# Patient Record
Sex: Male | Born: 1975 | Race: Black or African American | Hispanic: No | Marital: Married | Smoking: Never smoker
Health system: Southern US, Community
[De-identification: ages and names within clinical notes are randomized; demographics above are authoritative.]

## PROBLEM LIST (undated history)

## (undated) DIAGNOSIS — E785 Hyperlipidemia, unspecified: Secondary | ICD-10-CM

## (undated) DIAGNOSIS — D696 Thrombocytopenia, unspecified: Secondary | ICD-10-CM

---

## 2019-04-16 ENCOUNTER — Other Ambulatory Visit: Payer: Self-pay | Admitting: Surgery

## 2019-04-16 DIAGNOSIS — R1030 Lower abdominal pain, unspecified: Secondary | ICD-10-CM

## 2019-04-19 ENCOUNTER — Ambulatory Visit
Admission: RE | Admit: 2019-04-19 | Discharge: 2019-04-19 | Disposition: A | Discharge status: Home / Self Care | Payer: BLUE CROSS/BLUE SHIELD | Source: Ambulatory Visit | Attending: Surgery | Admitting: Surgery

## 2019-04-19 ENCOUNTER — Other Ambulatory Visit: Payer: Self-pay

## 2019-04-19 DIAGNOSIS — R1030 Lower abdominal pain, unspecified: Secondary | ICD-10-CM | POA: Diagnosis present

## 2019-04-19 MED ORDER — IOHEXOL 300 MG/ML  SOLN
100.0000 mL | Freq: Once | INTRAMUSCULAR | Status: AC | PRN
  Administered 2019-04-19: 100 mL via INTRAVENOUS

## 2019-10-19 ENCOUNTER — Emergency Department
Admission: EM | Admit: 2019-10-19 | Discharge: 2019-10-19 | Disposition: A | Discharge status: Home / Self Care | Payer: BLUE CROSS/BLUE SHIELD | Attending: Emergency Medicine | Admitting: Emergency Medicine

## 2019-10-19 ENCOUNTER — Other Ambulatory Visit: Payer: Self-pay

## 2019-10-19 ENCOUNTER — Emergency Department: Payer: BLUE CROSS/BLUE SHIELD

## 2019-10-19 DIAGNOSIS — R0789 Other chest pain: Secondary | ICD-10-CM | POA: Insufficient documentation

## 2019-10-19 DIAGNOSIS — Y999 Unspecified external cause status: Secondary | ICD-10-CM | POA: Diagnosis not present

## 2019-10-19 DIAGNOSIS — M546 Pain in thoracic spine: Secondary | ICD-10-CM | POA: Insufficient documentation

## 2019-10-19 DIAGNOSIS — Y9389 Activity, other specified: Secondary | ICD-10-CM | POA: Insufficient documentation

## 2019-10-19 MED ORDER — METHOCARBAMOL 500 MG PO TABS: 500.0000 mg | ORAL_TABLET | Freq: Three times a day (TID) | ORAL | 0 refills | Status: AC | PRN
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
## 2019-10-22 ENCOUNTER — Emergency Department
Admission: EM | Admit: 2019-10-22 | Discharge: 2019-10-22 | Disposition: A | Discharge status: Home / Self Care | Payer: BLUE CROSS/BLUE SHIELD | Source: Home / Self Care

## 2019-10-22 ENCOUNTER — Encounter: Payer: Self-pay | Admitting: Emergency Medicine

## 2019-10-22 ENCOUNTER — Emergency Department
Admission: EM | Admit: 2019-10-22 | Discharge: 2019-10-22 | Disposition: A | Discharge status: Home / Self Care | Payer: BLUE CROSS/BLUE SHIELD | Attending: Emergency Medicine | Admitting: Emergency Medicine

## 2019-10-22 ENCOUNTER — Other Ambulatory Visit: Payer: Self-pay

## 2019-10-22 ENCOUNTER — Emergency Department: Payer: BLUE CROSS/BLUE SHIELD

## 2019-10-22 DIAGNOSIS — M436 Torticollis: Secondary | ICD-10-CM | POA: Insufficient documentation

## 2019-10-22 DIAGNOSIS — M542 Cervicalgia: Secondary | ICD-10-CM | POA: Insufficient documentation

## 2019-10-22 DIAGNOSIS — Z041 Encounter for examination and observation following transport accident: Secondary | ICD-10-CM | POA: Insufficient documentation

## 2019-10-22 DIAGNOSIS — Z79899 Other long term (current) drug therapy: Secondary | ICD-10-CM | POA: Insufficient documentation

## 2019-10-22 DIAGNOSIS — M549 Dorsalgia, unspecified: Secondary | ICD-10-CM | POA: Insufficient documentation

## 2019-10-22 DIAGNOSIS — M25511 Pain in right shoulder: Secondary | ICD-10-CM | POA: Insufficient documentation

## 2019-10-22 MED ORDER — BACLOFEN 5 MG PO TABS: 5.0000 mg | ORAL_TABLET | Freq: Three times a day (TID) | ORAL | 0 refills | Status: DC | PRN

## 2019-10-22 NOTE — Discharge Instructions (Signed)
Take the Baclofen as prescribed. You may also take ibuprofen and tylenol.  See primary care or return to the ER if unable to schedule an appointment.
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
## 2019-10-22 NOTE — ED Notes (Signed)
Pt speaking with this RN in NAD, pt was told by PCP to come and get an xray of neck before physical therapy can be started for him.Pt reports pain in neck and upper shoulder area.
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
## 2020-01-31 ENCOUNTER — Other Ambulatory Visit: Payer: Self-pay

## 2020-01-31 ENCOUNTER — Inpatient Hospital Stay: Payer: BLUE CROSS/BLUE SHIELD | Attending: Oncology | Admitting: Oncology

## 2020-01-31 ENCOUNTER — Inpatient Hospital Stay: Payer: BLUE CROSS/BLUE SHIELD

## 2020-01-31 ENCOUNTER — Encounter: Payer: Self-pay | Admitting: Oncology

## 2020-01-31 VITALS — BP 104/62 | HR 68 | Temp 97.0°F | Resp 18 | Wt 207.0 lb

## 2020-01-31 DIAGNOSIS — D696 Thrombocytopenia, unspecified: Secondary | ICD-10-CM

## 2020-01-31 LAB — IRON AND TIBC
Iron: 102 ug/dL (ref 45–182)
Saturation Ratios: 32 % (ref 17.9–39.5)
TIBC: 322 ug/dL (ref 250–450)
UIBC: 220 ug/dL

## 2020-01-31 LAB — FERRITIN: Ferritin: 90 ng/mL (ref 24–336)

## 2020-01-31 LAB — CBC
HCT: 52.1 % — ABNORMAL HIGH (ref 39.0–52.0)
Hemoglobin: 15.6 g/dL (ref 13.0–17.0)
MCH: 26.7 pg (ref 26.0–34.0)
MCHC: 29.9 g/dL — ABNORMAL LOW (ref 30.0–36.0)
MCV: 89.2 fL (ref 80.0–100.0)
Platelets: 106 10*3/uL — ABNORMAL LOW (ref 150–400)
RBC: 5.84 MIL/uL — ABNORMAL HIGH (ref 4.22–5.81)
RDW: 12.7 % (ref 11.5–15.5)
WBC: 4 10*3/uL (ref 4.0–10.5)
nRBC: 0 % (ref 0.0–0.2)

## 2020-01-31 LAB — RETICULOCYTES
Immature Retic Fract: 5.6 % (ref 2.3–15.9)
RBC.: 5.79 MIL/uL (ref 4.22–5.81)
Retic Count, Absolute: 65.4 10*3/uL (ref 19.0–186.0)
Retic Ct Pct: 1.1 % (ref 0.4–3.1)

## 2020-01-31 LAB — FOLATE: Folate: 14.1 ng/mL (ref >5.9)

## 2020-01-31 LAB — VITAMIN B12: Vitamin B-12: 604 pg/mL (ref 180–914)

## 2020-01-31 LAB — LACTATE DEHYDROGENASE: LDH: 158 U/L (ref 98–192)

## 2020-01-31 NOTE — Progress Notes (Signed)
Patient here initial hematology evaluation and does not offer any problems today.

## 2020-02-01 LAB — PLATELET ANTIBODY PROFILE
Glycoprotein IV Antibody: NEGATIVE
HLA Ab Ser Ql EIA: NEGATIVE
IA/IIA Antibody: NEGATIVE
IB/IX Antibody: NEGATIVE
IIB/IIIA Antibody: NEGATIVE

## 2020-02-01 LAB — ANA W/REFLEX: Anti Nuclear Antibody (ANA): NEGATIVE

## 2020-02-01 NOTE — Progress Notes (Signed)
[facility]  [phone]) [phone] [phone]) [phone]  ID: Charles West OB: [birth year]  MR#: [id number]  AJO#:[id number]  Patient Care Team: Baxter Hire, MD as PCP - General (Internal Medicine)  CHIEF COMPLAINT: Thrombocytopenia  INTERVAL HISTORY: Patient is a 44 year old male who was noted to have a persistently decreased platelet count on routine blood work.  He currently feels well and is asymptomatic.  He denies any easy bleeding or bruising.  He has no neurologic complaints.  He denies any recent fevers or illnesses.  He has no new medications.  He has a good appetite and denies weight loss.  He has no chest pain, shortness of breath, cough, or hemoptysis.  He denies any nausea, vomiting, constipation, or diarrhea.  He has no urinary complaints.  Patient feels at his baseline and offers no specific complaints today.  REVIEW OF SYSTEMS:   Review of Systems  Constitutional: Negative.  Negative for fever, malaise/fatigue and weight loss.  Respiratory: Negative.  Negative for cough and shortness of breath.   Cardiovascular: Negative.  Negative for chest pain and leg swelling.  Gastrointestinal: Negative.  Negative for abdominal pain.  Genitourinary: Negative.  Negative for dysuria.  Musculoskeletal: Negative.  Negative for back pain.  Skin: Negative.  Negative for rash.  Neurological: Negative.  Negative for dizziness, focal weakness, weakness and headaches.  Endo/Heme/Allergies: Does not bruise/bleed easily.  Psychiatric/Behavioral: Negative.  The patient is not nervous/anxious.     As per HPI. Otherwise, a complete review of systems is negative.  PAST MEDICAL HISTORY: History reviewed. No pertinent past medical history.  PAST SURGICAL HISTORY: History reviewed. No pertinent surgical history.  FAMILY HISTORY: History reviewed. No pertinent family history.  ADVANCED DIRECTIVES (Y/N):  N  HEALTH MAINTENANCE: Social History   Tobacco Use  .  Smoking status: Never Smoker  . Smokeless tobacco: Never Used  Substance Use Topics  . Alcohol use: Never  . Drug use: Never     Colonoscopy:  PAP:  Bone density:  Lipid panel:  No Known Allergies  Current Outpatient Medications  Medication Sig Dispense Refill  . Baclofen 5 MG TABS Take 5 mg by mouth 3 (three) times daily as needed. (Patient not taking: Reported on 01/31/2020) 15 tablet 0   No current facility-administered medications for this visit.    OBJECTIVE: Vitals:   01/31/20 1131  BP: 104/62  Pulse: 68  Resp: 18  Temp: (!) 97 F (36.1 C)  SpO2: 100%     Body mass index is 28.87 kg/m.    ECOG FS:0 - Asymptomatic  General: Well-developed, well-nourished, no acute distress. Eyes: Pink conjunctiva, anicteric sclera. HEENT: Normocephalic, moist mucous membranes. Lungs: No audible wheezing or coughing. Heart: Regular rate and rhythm. Abdomen: Soft, nontender, no obvious distention. Musculoskeletal: No edema, cyanosis, or clubbing. Neuro: Alert, answering all questions appropriately. Cranial nerves grossly intact. Skin: No rashes or petechiae noted. Psych: Normal affect. Lymphatics: No cervical, calvicular, axillary or inguinal LAD.   LAB RESULTS:  No results found for: NA, K, CL, CO2, GLUCOSE, BUN, CREATININE, CALCIUM, PROT, ALBUMIN, AST, ALT, ALKPHOS, BILITOT, GFRNONAA, GFRAA  Lab Results  Component Value Date   WBC 4.0 01/31/2020   HGB 15.6 01/31/2020   HCT 52.1 (H) 01/31/2020   MCV 89.2 01/31/2020   PLT 106 (L) 01/31/2020     STUDIES: No results found.  ASSESSMENT: Thrombocytopenia  PLAN:    1. 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  Patient's platelet count remains decreased at 106 today.  The remainder of his laboratory work including iron stores, B12, folate, reticulocyte panel, and LDH are all within normal limits.  ANA and platelet antibodies are  pending at time of dictation.  Will get an ultrasound of the spleen to assess for splenomegaly for completeness.  Patient will then have video assisted telemedicine visit in 3 weeks to discuss his results.  I spent a total of 45 minutes reviewing chart data, face-to-face evaluation with the patient, counseling and coordination of care as detailed above.   Patient expressed understanding and was in agreement with this plan. He also understands that He can call clinic at any time with any questions, concerns, or complaints.    [name], MD   02/01/2020 7:30 AM

## 2020-02-06 ENCOUNTER — Ambulatory Visit
Admission: RE | Admit: 2020-02-06 | Discharge: 2020-02-06 | Disposition: A | Discharge status: Home / Self Care | Payer: BLUE CROSS/BLUE SHIELD | Source: Ambulatory Visit | Attending: Oncology | Admitting: Oncology

## 2020-02-06 ENCOUNTER — Other Ambulatory Visit: Payer: Self-pay

## 2020-02-06 DIAGNOSIS — D696 Thrombocytopenia, unspecified: Secondary | ICD-10-CM | POA: Insufficient documentation

## 2020-02-13 NOTE — Progress Notes (Signed)
Charles West  [phone] [phone]) [phone]  ID: Charles West OB: Nov 26, 1976  MR#: [id number]  VOZ#:[id number]  Patient Care Team: Baxter Hire, MD as PCP - General (Internal Medicine)  I connected with Charles West on 02/18/20 at  2:30 PM EDT by video enabled telemedicine visit and verified that I am speaking with the correct person using two identifiers.   I discussed the limitations, risks, security and privacy concerns of performing an evaluation and management service by telemedicine and the availability of in-person appointments. I also discussed with the patient that there may be a patient responsible charge related to this service. The patient expressed understanding and agreed to proceed.   Other persons participating in the visit and their role in the encounter: Patient, MD.  Patient's location: Home. Provider's location: Clinic.  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  INTERVAL HISTORY: Patient agreed to video assisted telemedicine visit for further evaluation and discussion of his laboratory and imaging results.  He continues to feel well and remains asymptomatic.  He denies any easy bleeding or bruising. He has no neurologic complaints.  He denies any recent fevers or illnesses.  He has a good appetite and denies weight loss.  He has no chest pain, shortness of breath, cough, or hemoptysis.  He denies any nausea, vomiting, constipation, or diarrhea.  He has no urinary complaints.  Patient offers no specific complaints today.  REVIEW OF SYSTEMS:   Review of Systems  Constitutional: Negative.  Negative for fever, malaise/fatigue and weight loss.  Respiratory: Negative.  Negative for cough and shortness of breath.   Cardiovascular: Negative.  Negative for chest pain and leg swelling.  Gastrointestinal: Negative.  Negative for abdominal pain.  Genitourinary: Negative.  Negative for dysuria.  Musculoskeletal: Negative.  Negative  for back pain.  Skin: Negative.  Negative for rash.  Neurological: Negative.  Negative for dizziness, focal weakness, weakness and headaches.  Endo/Heme/Allergies: Does not bruise/bleed easily.  Psychiatric/Behavioral: Negative.  The patient is not nervous/anxious.     As per HPI. Otherwise, a complete review of systems is negative.  PAST MEDICAL HISTORY: History reviewed. No pertinent past medical history.  PAST SURGICAL HISTORY: History reviewed. No pertinent surgical history.  FAMILY HISTORY: History reviewed. No pertinent family history.  ADVANCED DIRECTIVES (Y/N):  N  HEALTH MAINTENANCE: Social History   Tobacco Use  . Smoking status: Never Smoker  . Smokeless tobacco: Never Used  Substance Use Topics  . Alcohol use: Never  . Drug use: Never     Colonoscopy:  PAP:  Bone density:  Lipid panel:  No Known Allergies  Current Outpatient Medications  Medication Sig Dispense Refill  . Omega-3 Fatty Acids (FISH OIL) 1000 MG CAPS Take 1 capsule by mouth as needed.     No current facility-administered medications for this visit.    OBJECTIVE: There were no vitals filed for this visit.   There is no height or weight on file to calculate BMI.    ECOG FS:0 - Asymptomatic  General: Well-developed, well-nourished, no acute distress. HEENT: Normocephalic. Neuro: Alert, answering all questions appropriately. Cranial nerves grossly intact. Psych: Normal affect.   LAB RESULTS:  No results found for: NA, K, CL, CO2, GLUCOSE, BUN, CREATININE, CALCIUM, PROT, ALBUMIN, AST, ALT, ALKPHOS, BILITOT, GFRNONAA, GFRAA  Lab Results  Component Value Date   WBC 4.0 01/31/2020   HGB 15.6 01/31/2020   HCT 52.1 (H) 01/31/2020   MCV 89.2 01/31/2020   PLT 106 (L) 01/31/2020     STUDIES:  US Abdomen Limited  Result Date: 02/06/2020 CLINICAL DATA:  Thrombocytopenia. EXAM: ULTRASOUND ABDOMEN LIMITED COMPARISON:  Apr 19, 2019. FINDINGS: The spleen measures 7.0 x 9.8 x 4.3 cm with a  calculated volume of 154 cubic cm. This is within normal limits. No focal abnormality is noted. IMPRESSION: The spleen appears to be normal in size and appearance. No focal lesion is noted. Electronically Signed   By: Marijo Conception M.D.   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  PLAN:    1. 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  Patient's most recent platelet count is mildly decreased at 106, but this is chronic and unchanged since at least December 2018.  Previously, the remainder of his laboratory work including iron stores, B12, folate, reticulocyte panel, and LDH are all within normal limits.  ANA and platelet antibodies are also negative.  Ultrasound of the spleen on February 06, 2020 did not reveal splenomegaly.  No intervention is needed at this time.  Repeat laboratory work in 6 months with video assisted telemedicine visit 1 to 2 days later.    I provided 20 minutes of face-to-face video visit time during this encounter which included chart review, counseling, and coordination of care as documented above.   Patient expressed understanding and was in agreement with this plan. He also understands that He can call clinic at any time with any questions, concerns, or complaints.    [name], MD   02/18/2020 4:11 PM

## 2020-02-18 ENCOUNTER — Encounter: Payer: Self-pay | Admitting: Oncology

## 2020-02-18 ENCOUNTER — Inpatient Hospital Stay: Payer: BLUE CROSS/BLUE SHIELD | Attending: Oncology | Admitting: Oncology

## 2020-02-18 DIAGNOSIS — D696 Thrombocytopenia, unspecified: Secondary | ICD-10-CM

## 2020-02-27 ENCOUNTER — Ambulatory Visit: Payer: BLUE CROSS/BLUE SHIELD | Attending: Internal Medicine

## 2020-02-27 DIAGNOSIS — Z20822 Contact with and (suspected) exposure to covid-19: Secondary | ICD-10-CM

## 2020-02-28 LAB — SARS-COV-2, NAA 2 DAY TAT

## 2020-02-28 LAB — NOVEL CORONAVIRUS, NAA: SARS-CoV-2, NAA: NOT DETECTED

## 2020-03-09 ENCOUNTER — Emergency Department: Payer: BLUE CROSS/BLUE SHIELD

## 2020-03-09 ENCOUNTER — Other Ambulatory Visit: Payer: Self-pay

## 2020-03-09 ENCOUNTER — Inpatient Hospital Stay
Admission: EM | Admit: 2020-03-09 | Discharge: 2020-03-13 | DRG: 177 | Disposition: A | Discharge status: Home / Self Care | Payer: BLUE CROSS/BLUE SHIELD | Attending: Internal Medicine | Admitting: Internal Medicine

## 2020-03-09 ENCOUNTER — Encounter: Payer: Self-pay | Admitting: Emergency Medicine

## 2020-03-09 DIAGNOSIS — D696 Thrombocytopenia, unspecified: Secondary | ICD-10-CM

## 2020-03-09 DIAGNOSIS — J9601 Acute respiratory failure with hypoxia: Secondary | ICD-10-CM | POA: Diagnosis present

## 2020-03-09 DIAGNOSIS — E785 Hyperlipidemia, unspecified: Secondary | ICD-10-CM | POA: Diagnosis present

## 2020-03-09 DIAGNOSIS — U071 COVID-19: Principal | ICD-10-CM | POA: Diagnosis present

## 2020-03-09 DIAGNOSIS — J1282 Pneumonia due to coronavirus disease 2019: Secondary | ICD-10-CM | POA: Diagnosis present

## 2020-03-09 DIAGNOSIS — N179 Acute kidney failure, unspecified: Secondary | ICD-10-CM | POA: Diagnosis present

## 2020-03-09 DIAGNOSIS — J9801 Acute bronchospasm: Secondary | ICD-10-CM | POA: Diagnosis not present

## 2020-03-09 HISTORY — DX: Thrombocytopenia, unspecified: D69.6

## 2020-03-09 HISTORY — DX: Hyperlipidemia, unspecified: E78.5

## 2020-03-09 LAB — BASIC METABOLIC PANEL
Anion gap: 11 (ref 5–15)
BUN: 20 mg/dL (ref 6–20)
CO2: 24 mmol/L (ref 22–32)
Calcium: 8.4 mg/dL — ABNORMAL LOW (ref 8.9–10.3)
Chloride: 102 mmol/L (ref 98–111)
Creatinine, Ser: 1.35 mg/dL — ABNORMAL HIGH (ref 0.61–1.24)
GFR calc Af Amer: >60 mL/min (ref >60)
GFR calc non Af Amer: >60 mL/min (ref >60)
Glucose, Bld: 175 mg/dL — ABNORMAL HIGH (ref 70–99)
Potassium: 3.8 mmol/L (ref 3.5–5.1)
Sodium: 137 mmol/L (ref 135–145)

## 2020-03-09 LAB — PROCALCITONIN: Procalcitonin: 0.47 ng/mL

## 2020-03-09 LAB — LACTATE DEHYDROGENASE: LDH: 277 U/L — ABNORMAL HIGH (ref 98–192)

## 2020-03-09 LAB — FERRITIN: Ferritin: 398 ng/mL — ABNORMAL HIGH (ref 24–336)

## 2020-03-09 LAB — CBC
HCT: 45.4 % (ref 39.0–52.0)
Hemoglobin: 14.3 g/dL (ref 13.0–17.0)
MCH: 27.2 pg (ref 26.0–34.0)
MCHC: 31.5 g/dL (ref 30.0–36.0)
MCV: 86.5 fL (ref 80.0–100.0)
Platelets: 91 10*3/uL — ABNORMAL LOW (ref 150–400)
RBC: 5.25 MIL/uL (ref 4.22–5.81)
RDW: 13.1 % (ref 11.5–15.5)
WBC: 9.4 10*3/uL (ref 4.0–10.5)
nRBC: 0 % (ref 0.0–0.2)

## 2020-03-09 LAB — TROPONIN I (HIGH SENSITIVITY)
Troponin I (High Sensitivity): 5 ng/L (ref <18)
Troponin I (High Sensitivity): 6 ng/L (ref <18)

## 2020-03-09 LAB — BRAIN NATRIURETIC PEPTIDE: B Natriuretic Peptide: 80 pg/mL (ref 0.0–100.0)

## 2020-03-09 IMAGING — CT CT ANGIO CHEST
2 of 6 series · 19 of 46 positions shown · IV contrast (APPLIED)
Comparison: Two-view chest x-ray/[DATE]

CLINICAL DATA: Chest pain and shortness of breath for 3 days.

EXAM:
CT ANGIOGRAPHY CHEST WITH CONTRAST
TECHNIQUE: Multidetector CT imaging of the chest was performed using the
standard protocol during bolus administration of intravenous
contrast. Multiplanar CT image reconstructions and MIPs were
obtained to evaluate the vascular anatomy.
CONTRAST:  75mL OMNIPAQUE IOHEXOL 350 MG/ML SOLN

[Series 5: thins · axial · 0.70mm/px · z∈[+50,+294]mm · 17 of 268 slices shown]
[im 12/268  lung]
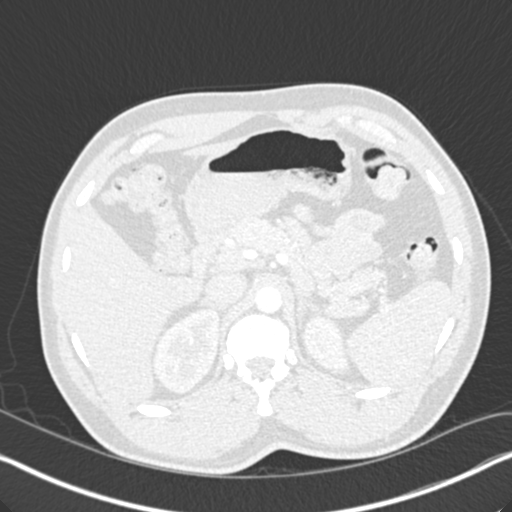
[im 24/268  soft-tissue]
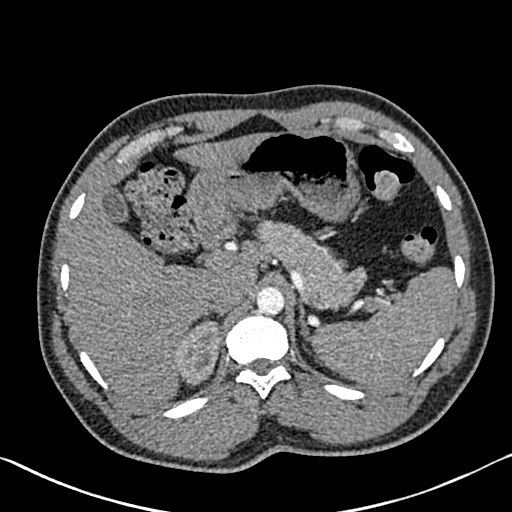
[im 47/268  lung]
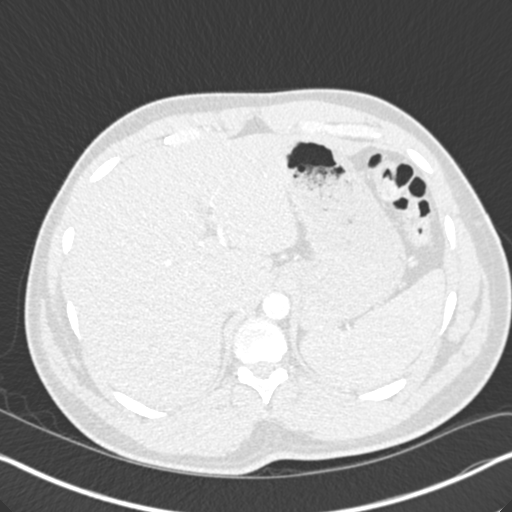
[im 59/268  soft-tissue]
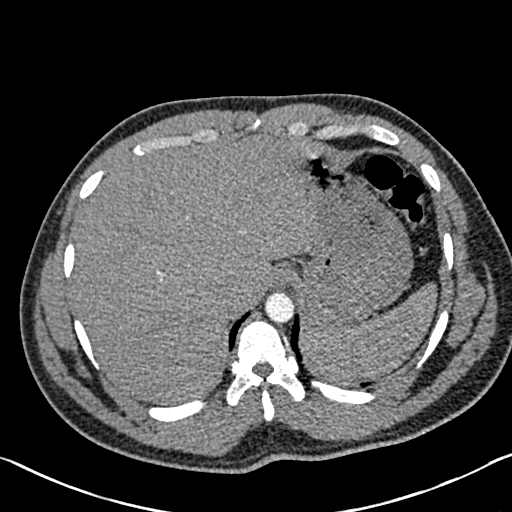
[im 70/268  lung]
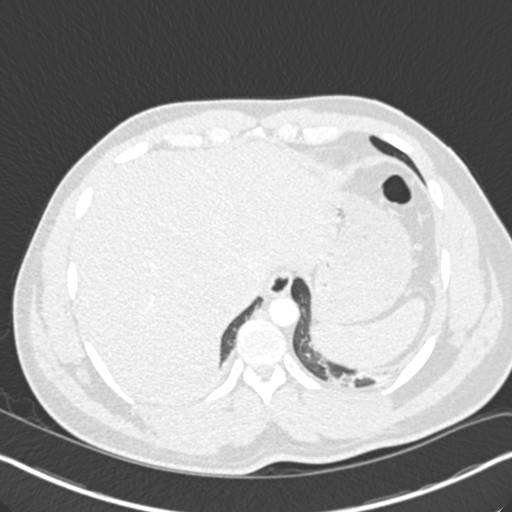
[im 93/268  soft-tissue]
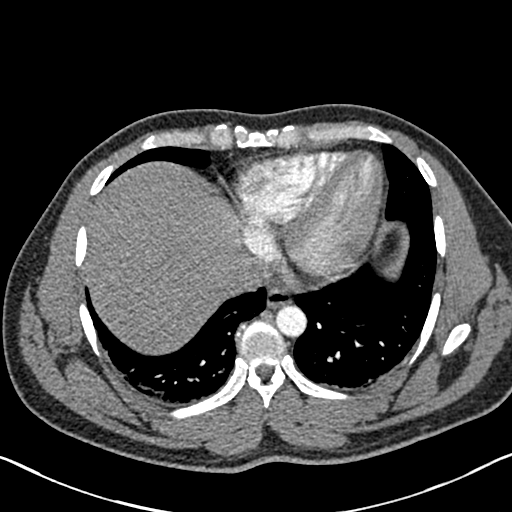
[im 105/268  lung]
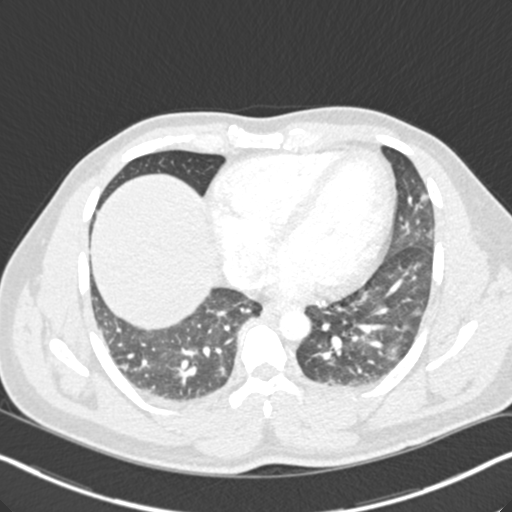
[im 117/268  soft-tissue]
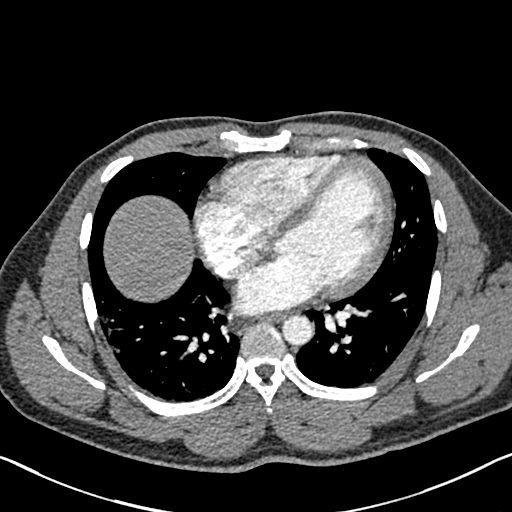
[im 140/268  lung]
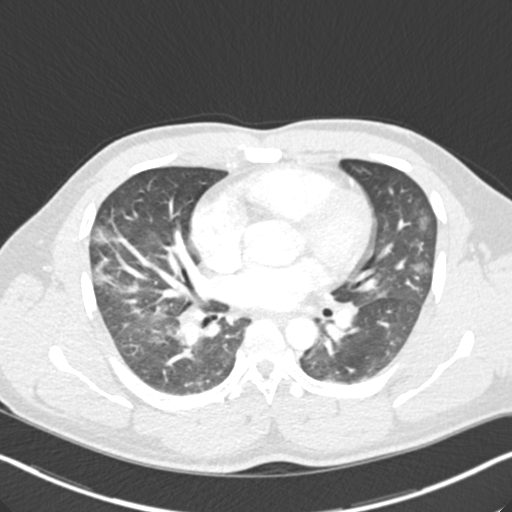
[im 151/268  soft-tissue]
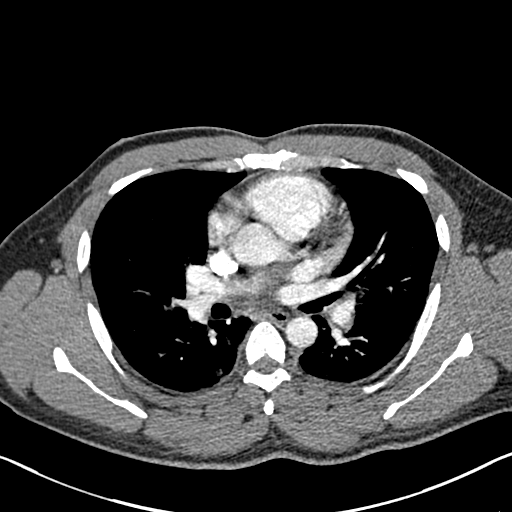
[im 163/268  lung]
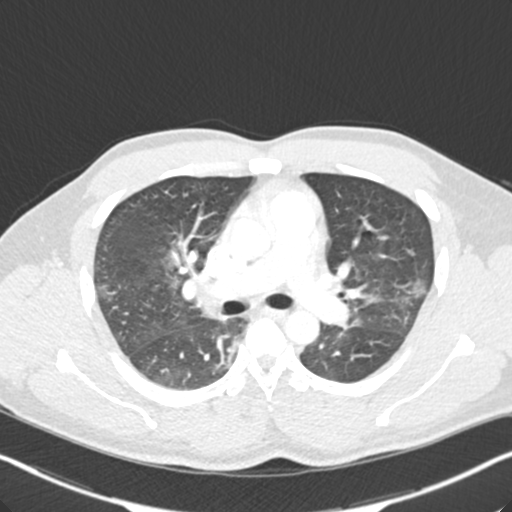
[im 175/268  soft-tissue]
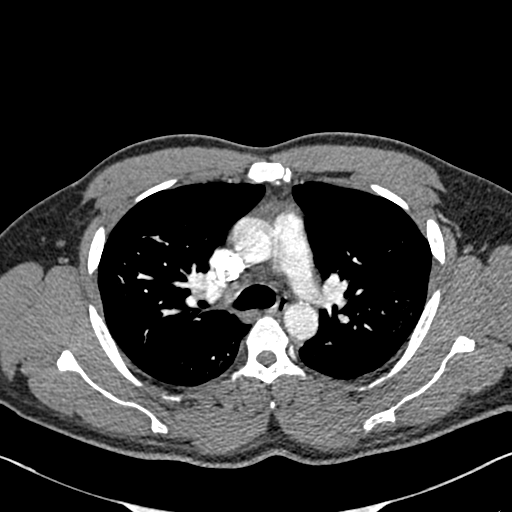
[im 198/268  lung]
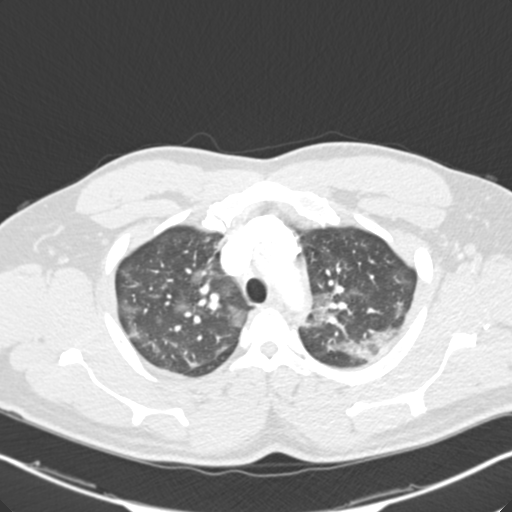
[im 209/268  soft-tissue]
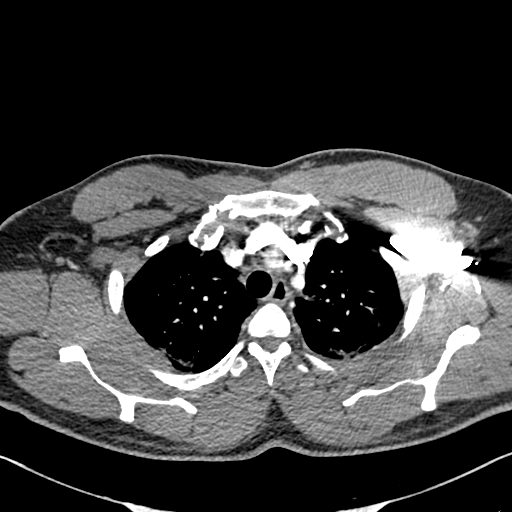
[im 221/268  lung]
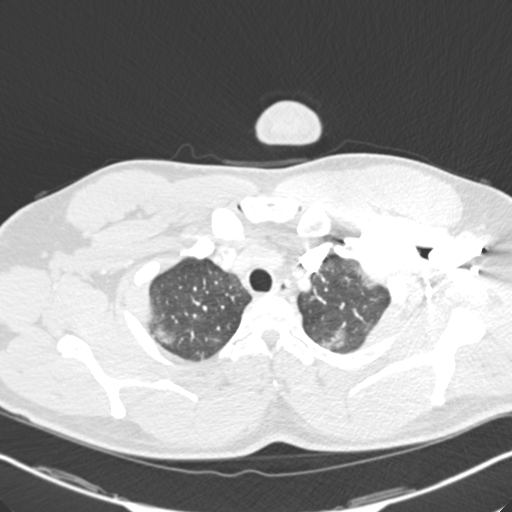
[im 244/268  soft-tissue]
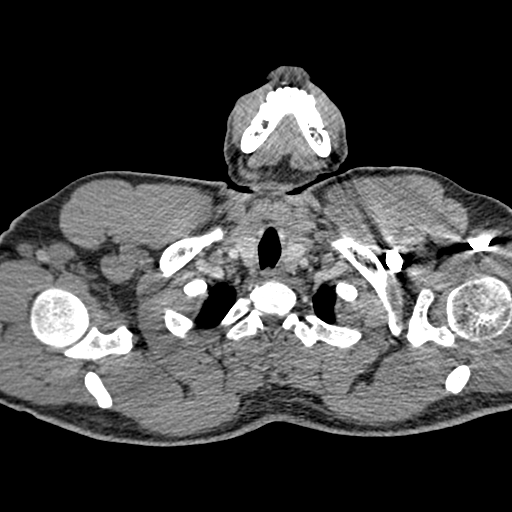
[im 256/268  lung]
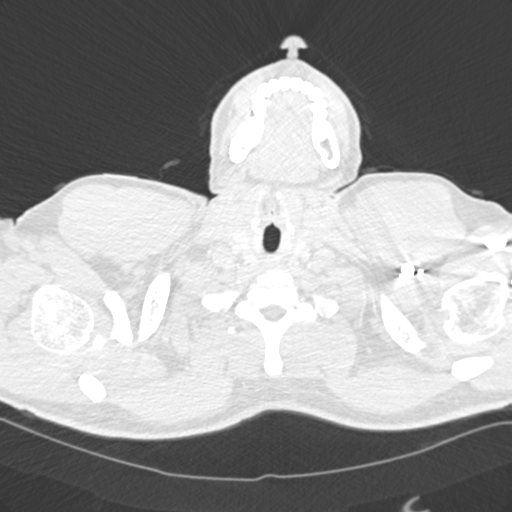

[Series 7: coronal mpr · coronal · 0.53mm/px · 2 of 92 slices shown]
[im 31/92  soft-tissue]
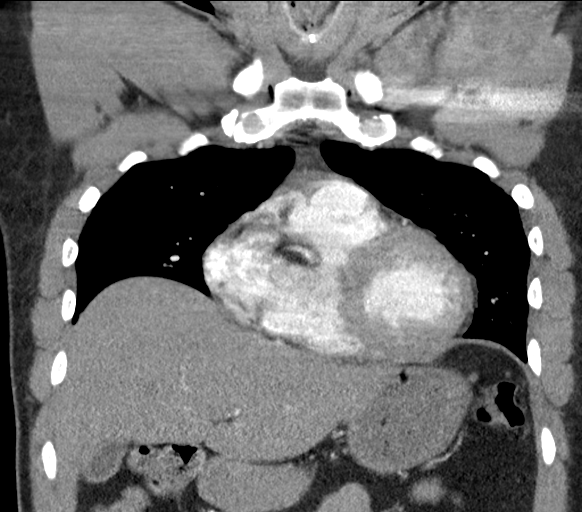
[im 61/92  soft-tissue]
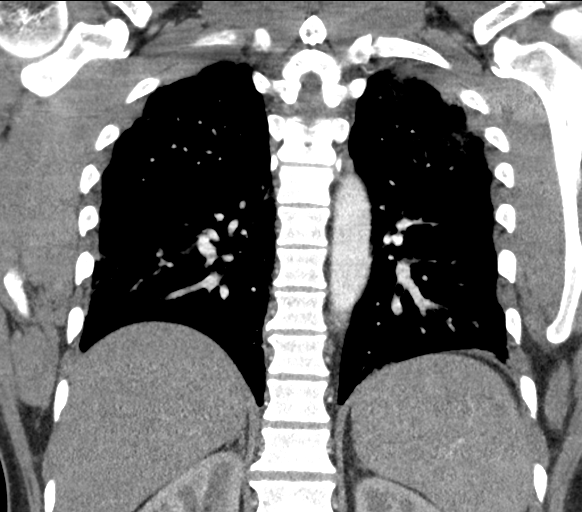

[19 of 46 positions shown; findings below may reference images not displayed]

FINDINGS: Cardiovascular: Heart size is normal. Aorta and great vessel origins
are within limits. Pulmonary artery size is normal. Opacification is
satisfactory. No focal filling defects are present to suggest
pulmonary emboli.

Mediastinum/Nodes: Subcentimeter para tracheal lymph nodes are
present. Mild lymphoid prominence is present at the hila
bilaterally. No significant axillary adenopathy is present.

Lungs/Pleura: Patchy peripheral airspace disease is present in the
lungs bilaterally. This is predominantly in the lower lobes.
Significant airspace consolidation is present. No nodule or mass
lesion is present. No pleural effusion or pneumothorax is present.

Upper Abdomen: Visualized upper abdomen is within normal limits.

Musculoskeletal: Vertebral body heights alignment are normal. No
focal lytic or blastic lesions are present.

Review of the MIP images confirms the above findings.
IMPRESSION: 1. Multifocal ground-glass opacities, predominantly peripheral and
greatest in the lower lobes, is concerning for multifocal pneumonia.
Viral pneumonia not excluded.
2. Subcentimeter paratracheal lymph nodes lymphoid tissue at the
hila bilaterally is likely reactive.

## 2020-03-09 MED ORDER — GUAIFENESIN-DM 100-10 MG/5ML PO SYRP
10.0000 mL | ORAL_SOLUTION | ORAL | Status: DC | PRN
  Administered 2020-03-10 – 2020-03-12 (×5): 10 mL via ORAL
  Filled 2020-03-09 (×6): qty 10

## 2020-03-09 MED ORDER — ONDANSETRON HCL 4 MG PO TABS: 4.0000 mg | ORAL_TABLET | Freq: Four times a day (QID) | ORAL | Status: DC | PRN

## 2020-03-09 MED ORDER — ZINC SULFATE 220 (50 ZN) MG PO CAPS
220.0000 mg | ORAL_CAPSULE | Freq: Every day | ORAL | Status: DC
  Administered 2020-03-09 – 2020-03-13 (×5): 220 mg via ORAL
  Filled 2020-03-09 (×5): qty 1

## 2020-03-09 MED ORDER — ASCORBIC ACID 500 MG PO TABS
500.0000 mg | ORAL_TABLET | Freq: Every day | ORAL | Status: DC
  Administered 2020-03-09 – 2020-03-13 (×5): 500 mg via ORAL
  Filled 2020-03-09 (×5): qty 1

## 2020-03-09 MED ORDER — ACETAMINOPHEN 500 MG PO TABS
1000.0000 mg | ORAL_TABLET | Freq: Once | ORAL | Status: DC
  Filled 2020-03-09: qty 2

## 2020-03-09 MED ORDER — SODIUM CHLORIDE 0.9 % IV BOLUS
1000.0000 mL | Freq: Once | INTRAVENOUS | Status: AC
  Administered 2020-03-09: 1000 mL via INTRAVENOUS

## 2020-03-09 MED ORDER — DEXAMETHASONE SODIUM PHOSPHATE 10 MG/ML IJ SOLN
6.0000 mg | Freq: Once | INTRAMUSCULAR | Status: AC
  Administered 2020-03-09: 6 mg via INTRAVENOUS
  Filled 2020-03-09: qty 1

## 2020-03-09 MED ORDER — SODIUM CHLORIDE 0.9 % IV SOLN
100.0000 mg | Freq: Every day | INTRAVENOUS | Status: AC
  Administered 2020-03-10 – 2020-03-13 (×4): 100 mg via INTRAVENOUS
  Filled 2020-03-09: qty 100
  Filled 2020-03-09: qty 20
  Filled 2020-03-09: qty 100
  Filled 2020-03-09: qty 20

## 2020-03-09 MED ORDER — ACETAMINOPHEN 325 MG PO TABS
650.0000 mg | ORAL_TABLET | Freq: Four times a day (QID) | ORAL | Status: DC | PRN
  Administered 2020-03-10: 650 mg via ORAL
  Filled 2020-03-09: qty 2

## 2020-03-09 MED ORDER — ONDANSETRON HCL 4 MG/2ML IJ SOLN
4.0000 mg | Freq: Once | INTRAMUSCULAR | Status: DC
  Filled 2020-03-09: qty 2

## 2020-03-09 MED ORDER — SODIUM CHLORIDE 0.9 % IV SOLN
200.0000 mg | Freq: Once | INTRAVENOUS | Status: AC
  Administered 2020-03-09: 200 mg via INTRAVENOUS
  Filled 2020-03-09: qty 40

## 2020-03-09 MED ORDER — IPRATROPIUM-ALBUTEROL 20-100 MCG/ACT IN AERS
1.0000 | INHALATION_SPRAY | Freq: Four times a day (QID) | RESPIRATORY_TRACT | Status: DC
Start: 2020-03-09 — End: 2020-03-13
  Administered 2020-03-09 – 2020-03-13 (×12): 1 via RESPIRATORY_TRACT
  Filled 2020-03-09: qty 4

## 2020-03-09 MED ORDER — ONDANSETRON HCL 4 MG/2ML IJ SOLN: 4.0000 mg | Freq: Four times a day (QID) | INTRAMUSCULAR | Status: DC | PRN

## 2020-03-09 MED ORDER — IOHEXOL 350 MG/ML SOLN
75.0000 mL | Freq: Once | INTRAVENOUS | Status: AC | PRN
  Administered 2020-03-09: 75 mL via INTRAVENOUS
  Filled 2020-03-09: qty 75

## 2020-03-09 MED ORDER — DEXAMETHASONE SODIUM PHOSPHATE 10 MG/ML IJ SOLN
6.0000 mg | INTRAMUSCULAR | Status: DC
  Administered 2020-03-10 – 2020-03-12 (×3): 6 mg via INTRAVENOUS
  Filled 2020-03-09 (×3): qty 1

## 2020-03-09 MED ORDER — GUAIFENESIN 100 MG/5ML PO SOLN
5.0000 mL | Freq: Once | ORAL | Status: AC
  Administered 2020-03-09: 100 mg via ORAL
  Filled 2020-03-09: qty 5

## 2020-03-09 MED ORDER — HYDROCOD POLST-CPM POLST ER 10-8 MG/5ML PO SUER
5.0000 mL | Freq: Two times a day (BID) | ORAL | Status: DC | PRN
  Administered 2020-03-09 – 2020-03-11 (×3): 5 mL via ORAL
  Filled 2020-03-09 (×3): qty 5

## 2020-03-09 NOTE — Consult Note (Signed)
Remdesivir - Pharmacy Brief Note  A/P:   On 02/27/20 patient's COVID results were "undected". On 03/02/20, patient was COVID positive.   Will order Remdesivir 200 mg IVPB once followed by 100 mg IVPB daily x 4 days.   Kristeen Miss, PharmD Clinical Pharmacist  03/09/2020 7:06 PM

## 2020-03-09 NOTE — ED Provider Notes (Signed)
[facility] Emergency Department Provider Note  ____________________________________________   First MD Initiated Contact with Patient 03/09/20 1536     (approximate)  I have reviewed the triage vital signs and the nursing notes.   HISTORY  Chief Complaint Shortness of Breath    HPI Charles West is a 44 y.o. male with known coronavirus possibly day 10 although patient is not exactly sure when his symptoms started.  Patient stated that his shortness of breath is gotten worse, moderate, not getting better with cough syrup, worse with exertion and with talking.  Patient reports that he went to [facility] clinic and was told to come to the ER for evaluation.  According to the notes it appears that his initial sat was in the 90s but then when they rechecked it was 95%.  Patient states that he has not been eating or drinking as well.  Patient was already prescribed azithromycin, cough syrup and steroids.          History reviewed. No pertinent past medical history.  There are no problems to display for this patient.   History reviewed. No pertinent surgical history.  Prior to Admission medications   Medication Sig Start Date End Date Taking? Authorizing Provider  Omega-3 Fatty Acids (FISH OIL) 1000 MG CAPS Take 1 capsule by mouth as needed.    [provider]    Allergies Patient has no known allergies.  No family history on file.  Social History Social History   Tobacco Use  . Smoking status: Never Smoker  . Smokeless tobacco: Never Used  Substance Use Topics  . Alcohol use: Never  . Drug use: Never      Review of Systems Constitutional: No fever/chills Eyes: No visual changes. ENT: No sore throat. Cardiovascular: Positive chest pain Respiratory: Positive for SOB, cough Gastrointestinal: No abdominal pain.  No nausea, no vomiting.  No diarrhea.  No constipation.  Decreased p.o. intake Genitourinary: Negative for  dysuria. Musculoskeletal: Negative for back pain. Skin: Negative for rash. Neurological: Negative for headaches, focal weakness or numbness. All other ROS negative ____________________________________________   PHYSICAL EXAM:  VITAL SIGNS: ED Triage Vitals  Enc Vitals Group     BP 03/09/20 1400 (!) 122/56     Pulse Rate 03/09/20 1400 93     Resp 03/09/20 1400 20     Temp 03/09/20 1400 98.9 F (37.2 C)     Temp Source 03/09/20 1400 Oral     SpO2 03/09/20 1400 96 %     Weight 03/09/20 1401 201 lb (91.2 kg)     Height 03/09/20 1401 5\' 11"  (1.803 m)     Head Circumference --      Peak Flow --      Pain Score 03/09/20 1400 6     Pain Loc --      Pain Edu? --      Excl. in [place]? --     Constitutional: Alert and oriented. Well appearing and in no acute distress. Eyes: Conjunctivae are normal. EOMI. Head: Atraumatic. Nose: No congestion/rhinnorhea. Mouth/Throat: Mucous membranes are moist.   Neck: No stridor. Trachea Midline. FROM Cardiovascular: Mild increased work of breathing with talking, no wheezing.  Occasional coughing. Respiratory: Clear lungs bilaterally, mild increased work of breathing with speaking Gastrointestinal: Soft and nontender. No distention. No abdominal bruits.  Musculoskeletal: No lower extremity tenderness nor edema.  No joint effusions. Neurologic:  Normal speech and language. No gross focal neurologic deficits are appreciated.  Skin:  Skin is  warm, dry and intact. No rash noted. Psychiatric: Mood and affect are normal. Speech and behavior are normal. GU: Deferred   ____________________________________________   LABS (all labs ordered are listed, but only abnormal results are displayed)  Labs Reviewed  BASIC METABOLIC PANEL - Abnormal; Notable for the following components:      Result Value   Glucose, Bld 175 (*)    Creatinine, Ser 1.35 (*)    Calcium 8.4 (*)    All other components within normal limits  CBC - Abnormal; Notable for the following  components:   Platelets 91 (*)    All other components within normal limits  TROPONIN I (HIGH SENSITIVITY)  TROPONIN I (HIGH SENSITIVITY)   ____________________________________________   ED ECG REPORT I, Vanessa Irwin, the attending physician, personally viewed and interpreted this ECG.  EKG is normal sinus rate of 95, no ST elevation but does have a S1Q3T3, normal intervals ____________________________________________  RADIOLOGY Robert Bellow, personally viewed and evaluated these images (plain radiographs) as part of my medical decision making, as well as reviewing the written report by the radiologist.  ED MD interpretation: No pneumonia  Official radiology report(s): DG Chest 2 View  Result Date: 03/09/2020 CLINICAL DATA:  Shortness of breath EXAM: CHEST - 2 VIEW COMPARISON:  October 19, 2019 FINDINGS: Degree of inspiration is shallow. Lungs are clear. Heart size and pulmonary vascularity are normal. No adenopathy. No bone lesions. IMPRESSION: No edema or consolidation.  Cardiac silhouette within normal limits. Electronically Signed   By: Lowella Grip III M.D.   On: 03/09/2020 14:39   CT Angio Chest PE W and/or Wo Contrast  Result Date: 03/09/2020 CLINICAL DATA:  Chest pain and shortness of breath for 3 days. EXAM: CT ANGIOGRAPHY CHEST WITH CONTRAST TECHNIQUE: Multidetector CT imaging of the chest was performed using the standard protocol during bolus administration of intravenous contrast. Multiplanar CT image reconstructions and MIPs were obtained to evaluate the vascular anatomy. CONTRAST:  66mL OMNIPAQUE IOHEXOL 350 MG/ML SOLN COMPARISON:  Two-view chest x-ray/5/1 FINDINGS: Cardiovascular: Heart size is normal. Aorta and great vessel origins are within limits. Pulmonary artery size is normal. Opacification is satisfactory. No focal filling defects are present to suggest pulmonary emboli. Mediastinum/Nodes: Subcentimeter para tracheal lymph nodes are present. Mild lymphoid  prominence is present at the hila bilaterally. No significant axillary adenopathy is present. Lungs/Pleura: Patchy peripheral airspace disease is present in the lungs bilaterally. This is predominantly in the lower lobes. Significant airspace consolidation is present. No nodule or mass lesion is present. No pleural effusion or pneumothorax is present. Upper Abdomen: Visualized upper abdomen is within normal limits. Musculoskeletal: Vertebral body heights alignment are normal. No focal lytic or blastic lesions are present. Review of the MIP images confirms the above findings. IMPRESSION: 1. Multifocal ground-glass opacities, predominantly peripheral and greatest in the lower lobes, is concerning for multifocal pneumonia. Viral pneumonia not excluded. 2. Subcentimeter paratracheal lymph nodes lymphoid tissue at the hila bilaterally is likely reactive. Electronically Signed   By: San Morelle M.D.   On: 03/09/2020 16:32    ____________________________________________   PROCEDURES  Procedure(s) performed (including Critical Care):  .Critical Care Performed by: Vanessa Weldon, MD Authorized by: Vanessa Hundred, MD   Critical care provider statement:    Critical care time (minutes):  31   Critical care was necessary to treat or prevent imminent or life-threatening deterioration of the following conditions:  Respiratory failure   Critical care was time spent personally by me on  the following activities:  Discussions with consultants, evaluation of patient's response to treatment, examination of patient, ordering and performing treatments and interventions, ordering and review of laboratory studies, ordering and review of radiographic studies, pulse oximetry, re-evaluation of patient's condition, obtaining history from patient or surrogate and review of old charts     ____________________________________________   INITIAL IMPRESSION / ASSESSMENT AND PLAN / ED COURSE   Wise [place]way was  evaluated in Emergency Department on 03/09/2020 for the symptoms described in the history of present illness. He was evaluated in the context of the global COVID-19 pandemic, which necessitated consideration that the patient might be at risk for infection with the SARS-CoV-2 virus that causes COVID-19. Institutional protocols and algorithms that pertain to the evaluation of patients at risk for COVID-19 are in a state of rapid change based on information released by regulatory bodies including the CDC and federal and state organizations. These policies and algorithms were followed during the patient's care in the ED.     Pt presents with SOB.  Patient has known coronavirus positive.  Patient has EKG showing S1Q3T3 and x-ray does not show significant changes to support his shortness of breath being from Covid alone.  I think it would be best to do a CT PE to make sure no evidence of pulmonary embolism.  D-dimer would not be effective given he has Covid.  Will get cardiac markers and EKG to evaluate for ACS labs to evaluate for anemia.  Work-up is negative this could just be from Covid.  Will make sure we get an ambulatory sat on him to make sure that he is not having episodes of desaturation. Will get labs evaluate for electrolyte abnormalities, AKI  Labs show slight elevation in his kidney function.  CT scan shows signs of Covid pneumonia but no PE.  Attempted to ambulate patient and he desats down to 90s and has a respiratory rate in the 40s.  Patient required 2 L with ambulation.  Given this new oxygen requirement in the setting of Covid pneumonia will admit for remdesivir and steroids.             ____________________________________________   FINAL CLINICAL IMPRESSION(S) / ED DIAGNOSES   Final diagnoses:  Acute respiratory failure with hypoxia ([place])  Pneumonia due to COVID-19 virus     MEDICATIONS GIVEN DURING THIS VISIT:  Medications  acetaminophen (TYLENOL) tablet 1,000 mg  (1,000 mg Oral Refused 03/09/20 1613)  remdesivir 200 mg in sodium chloride 0.9% 250 mL IVPB (has no administration in time range)    Followed by  remdesivir 100 mg in sodium chloride 0.9 % 100 mL IVPB (has no administration in time range)  Ipratropium-Albuterol (COMBIVENT) respimat 1 puff (has no administration in time range)  dexamethasone (DECADRON) injection 6 mg (has no administration in time range)  guaiFENesin-dextromethorphan (ROBITUSSIN DM) 100-10 MG/5ML syrup 10 mL (has no administration in time range)  chlorpheniramine-HYDROcodone (TUSSIONEX) 10-8 MG/5ML suspension 5 mL (has no administration in time range)  ascorbic acid (VITAMIN C) tablet 500 mg (has no administration in time range)  zinc sulfate capsule 220 mg (has no administration in time range)  acetaminophen (TYLENOL) tablet 650 mg (has no administration in time range)  ondansetron (ZOFRAN) tablet 4 mg (has no administration in time range)    Or  ondansetron (ZOFRAN) injection 4 mg (has no administration in time range)  sodium chloride 0.9 % bolus 1,000 mL (0 mLs Intravenous Stopped 03/09/20 1758)  iohexol (OMNIPAQUE) 350 MG/ML injection 75 mL (75  mLs Intravenous Contrast Given 03/09/20 1602)  guaiFENesin (ROBITUSSIN) 100 MG/5ML solution 100 mg (100 mg Oral Given 03/09/20 1643)  dexamethasone (DECADRON) injection 6 mg (6 mg Intravenous Given 03/09/20 1827)     ED Discharge Orders    None       Note:  This document was prepared using Dragon voice recognition software and may include unintentional dictation errors.   Vanessa Boone, MD 03/09/20 2002

## 2020-03-09 NOTE — ED Notes (Signed)
Admit MD at bedside

## 2020-03-09 NOTE — ED Triage Notes (Signed)
Patient presents to the ED after a diagnosis of covid19, 10 days ago.  Patient states he is having increased shortness of breath and continues to have a dry bothersome cough.  Patient states his oxygen level was 90% at [facility] earlier today.  Patient is speaking in full sentences at this time.

## 2020-03-09 NOTE — H&P (Signed)
History and Physical    Charles West [id number] DOB: 1976-01-07 DOA: 03/09/2020  PCP: Charles Hire, MD  Patient coming from: Home  I have personally briefly reviewed patient's old medical records in [place]  Chief Complaint: Dyspnea  HPI: Charles West is a 44 y.o. male with medical history significant for hyperlipidemia and thrombocytopenia who presents to the ED from urgent care for evaluation of hypoxia in setting of positive COVID-19 viral infection.  Patient states about 2 weeks ago his wife tested positive for COVID-19.  About 10 days ago he began develop sore throat with nighttime chills.  He was seen at [facility] clinic on 03/02/2020 at which time he was tested for COVID-19 which resulted positive.  He was seen again on 03/05/2020 and started on prednisone and albuterol treatment.  He was having continued symptoms and again was seen earlier today 03/09/2020 and was found to have diminished O2 saturation 90% on room air.  He was advised to come to the ED for further evaluation.  He states he has been having progressive worsening shortness of breath, worse with exertion.  He has been having frequent cough productive of brown sputum.  He has associated musculoskeletal chest wall pain due to his cough.  He says he has been having nighttime fevers with a temperature up to 102 Fahrenheit earlier today.  He has had chills and diaphoresis.  He otherwise denies any nausea, vomiting, abdominal pain, diarrhea, or dysuria.  He states he is not taking any medications chronically other than the recent medications prescribed for his COVID-19 viral infection.  He denies any history of tobacco, alcohol, or illicit drug use.  He is unaware of any medical conditions in his blood relatives.  He denies any prior surgeries.  ED Course:  Initial vitals showed BP 122/56, pulse 93, RR 20, temp 98.9 Fahrenheit, SPO2 96% on room air while at rest.  Per EDP O2 saturation dropped to 90% with  ambulation and became tachypneic when walking.  Labs are notable for WBC 9.4, hemoglobin 14.3, platelets 91,000, sodium 137, potassium 3.8, bicarb 24, BUN 20, creatinine 1.35, serum glucose 175, high-sensitivity troponin I 5 >>6.  2 view chest x-ray was without focal consolidation or edema.  CTA chest PE study showed multifocal groundglass opacities, predominantly peripheral and greatest in the lower lobes.  Patient was given 1 L normal saline, IV Decadron 6 mg once, guaifenesin, Tylenol.  The hospitalist service was consulted for further evaluation and management.  Review of Systems: All systems reviewed and are negative except as documented in history of present illness above.   Past Medical History:  Diagnosis Date  . Hyperlipidemia   . Thrombocytopenia ([place])     History reviewed. No pertinent surgical history.  Social History:  reports that he has never smoked. He has never used smokeless tobacco. He reports that he does not drink alcohol or use drugs.  No Known Allergies  History reviewed. No pertinent family history.   Prior to Admission medications   Medication Sig Start Date End Date Taking? Authorizing Provider  Omega-3 Fatty Acids (FISH OIL) 1000 MG CAPS Take 1 capsule by mouth as needed.    [provider]    Physical Exam: Vitals:   03/09/20 1758 03/09/20 1759 03/09/20 1900 03/09/20 1923  BP:      Pulse: 83 95 74   Resp:  (!) 40  (!) 24  Temp:      TempSrc:      SpO2: 95% 90% 96% 98%  Weight:      Height:       Constitutional: Resting supine in bed, NAD, calm, comfortable Eyes: PERRL, lids and conjunctivae normal ENMT: Mucous membranes are moist. Posterior pharynx clear of any exudate or lesions.Normal dentition.  Neck: normal, supple, no masses. Respiratory: clear to auscultation bilaterally, no wheezing, no crackles. Normal respiratory effort. No accessory muscle use.  Cardiovascular: Regular rate and rhythm, no murmurs / rubs / gallops. No  extremity edema. 2+ pedal pulses. Abdomen: no tenderness, no masses palpated. No hepatosplenomegaly. Bowel sounds positive.  Musculoskeletal: no clubbing / cyanosis. No joint deformity upper and lower extremities. Good ROM, no contractures. Normal muscle tone.  Skin: no rashes, lesions, ulcers. No induration Neurologic: CN 2-12 grossly intact. Sensation intact,Strength 5/5 in all 4.  Psychiatric: Normal judgment and insight. Alert and oriented x 3. Normal mood.     Labs on Admission: I have personally reviewed following labs and imaging studies  CBC: Recent Labs  Lab 03/09/20 1410  WBC 9.4  HGB 14.3  HCT 45.4  MCV 86.5  PLT 91*   Basic Metabolic Panel: Recent Labs  Lab 03/09/20 1410  NA 137  K 3.8  CL 102  CO2 24  GLUCOSE 175*  BUN 20  CREATININE 1.35*  CALCIUM 8.4*   GFR: Estimated Creatinine Clearance: 80.7 mL/min (A) (by C-G formula based on SCr of 1.35 mg/dL (H)). Liver Function Tests: No results for input(s): AST, ALT, ALKPHOS, BILITOT, PROT, ALBUMIN in the last 168 hours. No results for input(s): LIPASE, AMYLASE in the last 168 hours. No results for input(s): AMMONIA in the last 168 hours. Coagulation Profile: No results for input(s): INR, PROTIME in the last 168 hours. Cardiac Enzymes: No results for input(s): CKTOTAL, CKMB, CKMBINDEX, TROPONINI in the last 168 hours. BNP (last 3 results) No results for input(s): PROBNP in the last 8760 hours. HbA1C: No results for input(s): HGBA1C in the last 72 hours. CBG: No results for input(s): GLUCAP in the last 168 hours. Lipid Profile: No results for input(s): CHOL, HDL, LDLCALC, TRIG, CHOLHDL, LDLDIRECT in the last 72 hours. Thyroid Function Tests: No results for input(s): TSH, T4TOTAL, FREET4, T3FREE, THYROIDAB in the last 72 hours. Anemia Panel: No results for input(s): VITAMINB12, FOLATE, FERRITIN, TIBC, IRON, RETICCTPCT in the last 72 hours. Urine analysis: No results found for: COLORURINE, APPEARANCEUR,  [place], [place], [place], [place], BILIRUBINUR, [place], PROTEINUR, [place], NITRITE, LEUKOCYTESUR  Radiological Exams on Admission: DG Chest 2 View  Result Date: 03/09/2020 CLINICAL DATA:  Shortness of breath EXAM: CHEST - 2 VIEW COMPARISON:  October 19, 2019 FINDINGS: Degree of inspiration is shallow. Lungs are clear. Heart size and pulmonary vascularity are normal. No adenopathy. No bone lesions. IMPRESSION: No edema or consolidation.  Cardiac silhouette within normal limits. Electronically Signed   By: Lowella Grip III M.D.   On: 03/09/2020 14:39   CT Angio Chest PE W and/or Wo Contrast  Result Date: 03/09/2020 CLINICAL DATA:  Chest pain and shortness of breath for 3 days. EXAM: CT ANGIOGRAPHY CHEST WITH CONTRAST TECHNIQUE: Multidetector CT imaging of the chest was performed using the standard protocol during bolus administration of intravenous contrast. Multiplanar CT image reconstructions and MIPs were obtained to evaluate the vascular anatomy. CONTRAST:  57mL OMNIPAQUE IOHEXOL 350 MG/ML SOLN COMPARISON:  Two-view chest x-ray/5/1 FINDINGS: Cardiovascular: Heart size is normal. Aorta and great vessel origins are within limits. Pulmonary artery size is normal. Opacification is satisfactory. No focal filling defects are present to suggest pulmonary emboli. Mediastinum/Nodes: Subcentimeter para tracheal  lymph nodes are present. Mild lymphoid prominence is present at the hila bilaterally. No significant axillary adenopathy is present. Lungs/Pleura: Patchy peripheral airspace disease is present in the lungs bilaterally. This is predominantly in the lower lobes. Significant airspace consolidation is present. No nodule or mass lesion is present. No pleural effusion or pneumothorax is present. Upper Abdomen: Visualized upper abdomen is within normal limits. Musculoskeletal: Vertebral body heights alignment are normal. No focal lytic or blastic lesions are present. Review of the MIP images confirms  the above findings. IMPRESSION: 1. Multifocal ground-glass opacities, predominantly peripheral and greatest in the lower lobes, is concerning for multifocal pneumonia. Viral pneumonia not excluded. 2. Subcentimeter paratracheal lymph nodes lymphoid tissue at the hila bilaterally is likely reactive. Electronically Signed   By: San Morelle M.D.   On: 03/09/2020 16:32    EKG: Independently reviewed. Normal sinus rhythm, T wave inversion lead III.  When compared to prior T wave inversion is new.  Assessment/Plan Principal Problem:   Acute hypoxemic respiratory failure due to COVID-19 [facility]) Active Problems:   Thrombocytopenia (HCC)  Stellan Ferman is a 44 y.o. male with medical history significant for hyperlipidemia and thrombocytopenia who is admitted with acute hypoxemic respiratory failure COVID-19 pneumonia.  Acute hypoxemic respiratory failure due to COVID-19 pneumonia: SARS-CoV-2 PCR positive on 03/02/2020 (result available in care everywhere).  He has been having progressive symptoms with tachypnea and SPO2 90% with ambulation.  CTA chest PE study shows bilateral groundglass opacities. -Start IV remdesivir per pharmacy protocol -Continue IV Decadron 6 mg daily -Supplemental oxygen as needed -Continue Combivent, incentive spirometer, flutter valve -Antitussives, vitamin C, zinc -Obtain baseline inflammatory labs and monitor daily  Thrombocytopenia: Platelets 91,000.  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  He has no obvious bleeding.  DVT prophylaxis: SCDs given thrombocytopenia Code Status: Full code, confirmed with patient Family Communication: Discussed with patient, he has discussed with his family Disposition Plan: From home, likely discharge to home pending symptomatic improvement and ability to transition to outpatient care with completion of IV remdesivir in hospital versus outpatient. Consults called: None Admission status:  Inpatient, patient likely requires greater than 2 midnight length stay for management of acute hypoxic respiratory failure due to COVID-19 pneumonia for continued IV antiviral and steroid treatments as he is high risk for further decompensation.  Zada Finders MD [facility]  If 7PM-7AM, please contact night-coverage [email or web address]  03/09/2020, 7:36 PM

## 2020-03-10 LAB — COMPREHENSIVE METABOLIC PANEL
ALT: 31 U/L (ref 0–44)
AST: 25 U/L (ref 15–41)
Albumin: 3.1 g/dL — ABNORMAL LOW (ref 3.5–5.0)
Alkaline Phosphatase: 42 U/L (ref 38–126)
Anion gap: 8 (ref 5–15)
BUN: 18 mg/dL (ref 6–20)
CO2: 27 mmol/L (ref 22–32)
Calcium: 8.4 mg/dL — ABNORMAL LOW (ref 8.9–10.3)
Chloride: 105 mmol/L (ref 98–111)
Creatinine, Ser: 1.32 mg/dL — ABNORMAL HIGH (ref 0.61–1.24)
GFR calc Af Amer: >60 mL/min (ref >60)
GFR calc non Af Amer: >60 mL/min (ref >60)
Glucose, Bld: 129 mg/dL — ABNORMAL HIGH (ref 70–99)
Potassium: 4.5 mmol/L (ref 3.5–5.1)
Sodium: 140 mmol/L (ref 135–145)
Total Bilirubin: 0.7 mg/dL (ref 0.3–1.2)
Total Protein: 7.2 g/dL (ref 6.5–8.1)

## 2020-03-10 LAB — CBC WITH DIFFERENTIAL/PLATELET
Abs Immature Granulocytes: 0.09 10*3/uL — ABNORMAL HIGH (ref 0.00–0.07)
Basophils Absolute: 0 10*3/uL (ref 0.0–0.1)
Basophils Relative: 0 %
Eosinophils Absolute: 0 10*3/uL (ref 0.0–0.5)
Eosinophils Relative: 0 %
HCT: 44.2 % (ref 39.0–52.0)
Hemoglobin: 13.8 g/dL (ref 13.0–17.0)
Immature Granulocytes: 1 %
Lymphocytes Relative: 6 %
Lymphs Abs: 0.7 10*3/uL (ref 0.7–4.0)
MCH: 27.2 pg (ref 26.0–34.0)
MCHC: 31.2 g/dL (ref 30.0–36.0)
MCV: 87.2 fL (ref 80.0–100.0)
Monocytes Absolute: 0.5 10*3/uL (ref 0.1–1.0)
Monocytes Relative: 5 %
Neutro Abs: 9.2 10*3/uL — ABNORMAL HIGH (ref 1.7–7.7)
Neutrophils Relative %: 88 %
Platelets: 102 10*3/uL — ABNORMAL LOW (ref 150–400)
RBC: 5.07 MIL/uL (ref 4.22–5.81)
RDW: 13.2 % (ref 11.5–15.5)
WBC: 10.5 10*3/uL (ref 4.0–10.5)
nRBC: 0 % (ref 0.0–0.2)

## 2020-03-10 LAB — C-REACTIVE PROTEIN
CRP: 12.9 mg/dL — ABNORMAL HIGH (ref <1.0)
CRP: 13.1 mg/dL — ABNORMAL HIGH (ref <1.0)

## 2020-03-10 LAB — ABO/RH: ABO/RH(D): B POS

## 2020-03-10 LAB — HIV ANTIBODY (ROUTINE TESTING W REFLEX): HIV Screen 4th Generation wRfx: NONREACTIVE

## 2020-03-10 LAB — FIBRIN DERIVATIVES D-DIMER (ARMC ONLY): Fibrin derivatives D-dimer (ARMC): 761.73 ng/mL (FEU) — ABNORMAL HIGH (ref 0.00–499.00)

## 2020-03-10 LAB — MAGNESIUM: Magnesium: 2.3 mg/dL (ref 1.7–2.4)

## 2020-03-10 LAB — PHOSPHORUS: Phosphorus: 4.3 mg/dL (ref 2.5–4.6)

## 2020-03-10 LAB — FERRITIN: Ferritin: 390 ng/mL — ABNORMAL HIGH (ref 24–336)

## 2020-03-10 NOTE — Progress Notes (Signed)
PROGRESS NOTE    Charles West  [id number]  DOB: 1976/10/03  DOA: 03/09/2020 PCP: Baxter Hire, MD Outpatient Specialists:   Hospital course:  44 year old otherwise healthy man was diagnosed with COVID-19 infection 03/02/2020.  He was started on steroids and albuterol on 03/05/2020 however had at home he had fevers to 102, ongoing sore throat, worsening shortness of breath, worsened cough productive of brown phlegm.  He was seen in ED on and was admitted on 03/09/2020 with O2 saturations 90% on room air with ambulation.  Chest x-ray did not show any focal consolidation or edema however chest CTA showed multifocal groundglass opacities consistent with Covid pneumonia.   Subjective:  Patient states he feels somewhat better than yesterday with less shortness of breath he thinks.  His main complaints are a significant cough every time he tries to take a deep breath and a sore throat.   Objective: Vitals:   03/09/20 2302 03/10/20 0211 03/10/20 0554 03/10/20 0813  BP: 132/71 127/77 121/70 (!) 145/65  Pulse: 74 60 (!) 59 97  Resp: (!) 24 20 (!) 22 17  Temp: (!) 100.5 F (38.1 C) 98.6 F (37 C) 98.6 F (37 C)   TempSrc: Oral Oral Oral   SpO2: 99% 99% 98% 94%  Weight:      Height:        Intake/Output Summary (Last 24 hours) at 03/10/2020 1318 Last data filed at 03/10/2020 1302 Gross per 24 hour  Intake 750 ml  Output 2075 ml  Net -1325 ml   Filed Weights   03/09/20 1401  Weight: 91.2 kg     Assessment & Plan:   Acute hypoxemic respiratory failure due to COVID-19 pneumonia: Patient is symptomatically improved today after initiation of steroids Of note CRP is 13, procalcitonin is 0.47 CTA with no PE but does have multifocal groundglass opacities Continue oxygen as needed, presently on 2 L at rest, will need to follow closely Steroids 2/10 Remdesivir 1/5  No wheezes on exam however cough with deep inhalation is usually bronchospastic Add Ventolin inhaler to  Combivent which he is already on.  Thrombocytopenia: Platelets 91,000.  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  He has no obvious bleeding.   DVT prophylaxis: SCDs given platelets 91K Code Status: Full code Family Communication: Patient is in close contact with his family Disposition Plan:   Patient is from: Home  Anticipated Discharge Location: Home  Barriers to Discharge: Acute ongoing Covid pneumonia  Is patient medically stable for Discharge: No   Consultants:  None  Procedures:  None  Antimicrobials:  None   Exam:  General: Somewhat tired appearing young man sitting up in bed with tachypnea but no respiratory distress, mild flaring of nostrils.  Able to speak in full sentences with mild shortness of breath. Eyes: sclera anicteric, conjuctiva mild injection bilaterally CVS: S1-S2, regular  Respiratory: Reasonable air entry bilaterally without wheezes. GI: NABS, soft, NT  LE: No edema.  Neuro: A/O x 3, Moving all extremities equally with normal strength, CN 3-12 intact, grossly nonfocal.  Psych: patient is logical and coherent, judgement and insight appear normal, mood and affect appropriate to situation.   Data Reviewed: Basic Metabolic Panel: Recent Labs  Lab 03/09/20 1410 03/10/20 0524  NA 137 140  K 3.8 4.5  CL 102 105  CO2 24 27  GLUCOSE 175* 129*  BUN 20 18  CREATININE 1.35* 1.32*  CALCIUM 8.4* 8.4*  MG  --  2.3  PHOS  --  4.3   Liver Function Tests: Recent Labs  Lab 03/10/20 0524  AST 25  ALT 31  ALKPHOS 42  BILITOT 0.7  PROT 7.2  ALBUMIN 3.1*   No results for input(s): LIPASE, AMYLASE in the last 168 hours. No results for input(s): AMMONIA in the last 168 hours. CBC: Recent Labs  Lab 03/09/20 1410 03/10/20 0524  WBC 9.4 10.5  NEUTROABS  --  9.2*  HGB 14.3 13.8  HCT 45.4 44.2  MCV 86.5 87.2  PLT 91* 102*   Cardiac Enzymes: No results for input(s): CKTOTAL, CKMB, CKMBINDEX, TROPONINI in  the last 168 hours. BNP (last 3 results) No results for input(s): PROBNP in the last 8760 hours. CBG: No results for input(s): GLUCAP in the last 168 hours.  No results found for this or any previous visit (from the past 240 hour(s)).    Studies: DG Chest 2 View  Result Date: 03/09/2020 CLINICAL DATA:  Shortness of breath EXAM: CHEST - 2 VIEW COMPARISON:  October 19, 2019 FINDINGS: Degree of inspiration is shallow. Lungs are clear. Heart size and pulmonary vascularity are normal. No adenopathy. No bone lesions. IMPRESSION: No edema or consolidation.  Cardiac silhouette within normal limits. Electronically Signed   By: Lowella Grip III M.D.   On: 03/09/2020 14:39   CT Angio Chest PE W and/or Wo Contrast  Result Date: 03/09/2020 CLINICAL DATA:  Chest pain and shortness of breath for 3 days. EXAM: CT ANGIOGRAPHY CHEST WITH CONTRAST TECHNIQUE: Multidetector CT imaging of the chest was performed using the standard protocol during bolus administration of intravenous contrast. Multiplanar CT image reconstructions and MIPs were obtained to evaluate the vascular anatomy. CONTRAST:  68mL OMNIPAQUE IOHEXOL 350 MG/ML SOLN COMPARISON:  Two-view chest x-ray/5/1 FINDINGS: Cardiovascular: Heart size is normal. Aorta and great vessel origins are within limits. Pulmonary artery size is normal. Opacification is satisfactory. No focal filling defects are present to suggest pulmonary emboli. Mediastinum/Nodes: Subcentimeter para tracheal lymph nodes are present. Mild lymphoid prominence is present at the hila bilaterally. No significant axillary adenopathy is present. Lungs/Pleura: Patchy peripheral airspace disease is present in the lungs bilaterally. This is predominantly in the lower lobes. Significant airspace consolidation is present. No nodule or mass lesion is present. No pleural effusion or pneumothorax is present. Upper Abdomen: Visualized upper abdomen is within normal limits. Musculoskeletal: Vertebral  body heights alignment are normal. No focal lytic or blastic lesions are present. Review of the MIP images confirms the above findings. IMPRESSION: 1. Multifocal ground-glass opacities, predominantly peripheral and greatest in the lower lobes, is concerning for multifocal pneumonia. Viral pneumonia not excluded. 2. Subcentimeter paratracheal lymph nodes lymphoid tissue at the hila bilaterally is likely reactive. Electronically Signed   By: San Morelle M.D.   On: 03/09/2020 16:32     Scheduled Meds: . acetaminophen  1,000 mg Oral Once  . vitamin C  500 mg Oral Daily  . dexamethasone (DECADRON) injection  6 mg Intravenous Q24H  . Ipratropium-Albuterol  1 puff Inhalation Q6H  . zinc sulfate  220 mg Oral Daily   Continuous Infusions: . remdesivir 100 mg in NS 100 mL Stopped (03/10/20 0944)    Principal Problem:   Acute hypoxemic respiratory failure due to COVID-19 [facility]) Active Problems:   Thrombocytopenia ([place])     Francella Barnett [name], [facility]  If 7PM-7AM, please contact night-coverage [email or web address] Password [email or web address] 03/10/2020, 1:18 PM    LOS: 1 day

## 2020-03-11 LAB — CBC WITH DIFFERENTIAL/PLATELET
Abs Immature Granulocytes: 0.08 10*3/uL — ABNORMAL HIGH (ref 0.00–0.07)
Basophils Absolute: 0 10*3/uL (ref 0.0–0.1)
Basophils Relative: 0 %
Eosinophils Absolute: 0 10*3/uL (ref 0.0–0.5)
Eosinophils Relative: 0 %
HCT: 47 % (ref 39.0–52.0)
Hemoglobin: 14.4 g/dL (ref 13.0–17.0)
Immature Granulocytes: 1 %
Lymphocytes Relative: 9 %
Lymphs Abs: 0.9 10*3/uL (ref 0.7–4.0)
MCH: 27.3 pg (ref 26.0–34.0)
MCHC: 30.6 g/dL (ref 30.0–36.0)
MCV: 89 fL (ref 80.0–100.0)
Monocytes Absolute: 0.5 10*3/uL (ref 0.1–1.0)
Monocytes Relative: 5 %
Neutro Abs: 8.3 10*3/uL — ABNORMAL HIGH (ref 1.7–7.7)
Neutrophils Relative %: 85 %
Platelets: 135 10*3/uL — ABNORMAL LOW (ref 150–400)
RBC: 5.28 MIL/uL (ref 4.22–5.81)
RDW: 13.1 % (ref 11.5–15.5)
Smear Review: NORMAL
WBC: 9.8 10*3/uL (ref 4.0–10.5)
nRBC: 0 % (ref 0.0–0.2)

## 2020-03-11 LAB — COMPREHENSIVE METABOLIC PANEL
ALT: 37 U/L (ref 0–44)
AST: 27 U/L (ref 15–41)
Albumin: 3.2 g/dL — ABNORMAL LOW (ref 3.5–5.0)
Alkaline Phosphatase: 43 U/L (ref 38–126)
Anion gap: 7 (ref 5–15)
BUN: 22 mg/dL — ABNORMAL HIGH (ref 6–20)
CO2: 26 mmol/L (ref 22–32)
Calcium: 8.6 mg/dL — ABNORMAL LOW (ref 8.9–10.3)
Chloride: 104 mmol/L (ref 98–111)
Creatinine, Ser: 1.06 mg/dL (ref 0.61–1.24)
GFR calc Af Amer: >60 mL/min (ref >60)
GFR calc non Af Amer: >60 mL/min (ref >60)
Glucose, Bld: 119 mg/dL — ABNORMAL HIGH (ref 70–99)
Potassium: 4.5 mmol/L (ref 3.5–5.1)
Sodium: 137 mmol/L (ref 135–145)
Total Bilirubin: 0.9 mg/dL (ref 0.3–1.2)
Total Protein: 7.2 g/dL (ref 6.5–8.1)

## 2020-03-11 LAB — C-REACTIVE PROTEIN: CRP: 4.4 mg/dL — ABNORMAL HIGH (ref <1.0)

## 2020-03-11 LAB — FERRITIN: Ferritin: 463 ng/mL — ABNORMAL HIGH (ref 24–336)

## 2020-03-11 LAB — FIBRIN DERIVATIVES D-DIMER (ARMC ONLY): Fibrin derivatives D-dimer (ARMC): 861.21 ng/mL (FEU) — ABNORMAL HIGH (ref 0.00–499.00)

## 2020-03-11 LAB — MAGNESIUM: Magnesium: 2.4 mg/dL (ref 1.7–2.4)

## 2020-03-11 LAB — PHOSPHORUS: Phosphorus: 4.1 mg/dL (ref 2.5–4.6)

## 2020-03-11 NOTE — Progress Notes (Signed)
Pt ambulated in the room without oxygen. He did not drop below 94% while ambulating on room air. Pt now sitting in chair on room air sating at 97%. No complaints at the moment from the patient.

## 2020-03-11 NOTE — Progress Notes (Signed)
Pt expressed need for assistance getting home tomorrow if he is discharged from the hospital. The family car broke down and the patient or his family has no transportation. Pt also will need assistance getting to and from [facility] for the infusion on Friday is he is discharged. Case management has left for the day, but this RN sent Jhonnie Garner a message stating the needs of this patient.

## 2020-03-11 NOTE — Progress Notes (Signed)
PROGRESS NOTE    Charles West  [id number]  DOB: 1975-12-19  DOA: 03/09/2020 PCP: Baxter Hire, MD Outpatient Specialists:   Hospital course:  44 year old otherwise healthy man was diagnosed with COVID-19 infection 03/02/2020.  He was started on steroids and albuterol on 03/05/2020 however had at home he had fevers to 102, ongoing sore throat, worsening shortness of breath, worsened cough productive of brown phlegm.  He was seen in ED on and was admitted on 03/09/2020 with O2 saturations 90% on room air with ambulation.  Chest x-ray did not show any focal consolidation or edema however chest CTA showed multifocal groundglass opacities consistent with Covid pneumonia.   Subjective:  Patient states he feels better.  Cough is improved.  Able to take deep breaths without coughing.  However states he is a little afraid to go home in case he become short of breath again.  Notes he has been going to the bathroom with oxygen on without too much difficulty although slightly winded when he returns.  Has not tried ambulating without oxygen.  Objective: Vitals:   03/10/20 0813 03/10/20 1616 03/10/20 2115 03/11/20 0826  BP: (!) 145/65 129/84 (!) 122/56 109/66  Pulse: 97 78  (!) 56  Resp: 17 18 19 20   Temp:  98.5 F (36.9 C) 98.3 F (36.8 C)   TempSrc:  Oral Oral   SpO2: 94% 96%  99%  Weight:      Height:        Intake/Output Summary (Last 24 hours) at 03/11/2020 1505 Last data filed at 03/11/2020 1123 Gross per 24 hour  Intake 580 ml  Output 800 ml  Net -220 ml   Filed Weights   03/09/20 1401  Weight: 91.2 kg     Assessment & Plan:   Acute hypoxemic respiratory failure due to COVID-19 pneumonia: Patient continues to improve, is able to take deep breaths today without bronchospasm. CRP is down to 4 from 13 overnight. CTA with no PE but does have multifocal groundglass opacities Patient remains on 2 L of nasal cannula oxygen, will try to wean down if tolerated. Steroids  3/10 Remdesivir 2/5  AKI Creatinine has normalized from 1.3-1.06 today.  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     DVT prophylaxis: SCDs given platelets 91K Code Status: Full code Family Communication: Patient is in close contact with his family Disposition Plan:   Patient is from: Home  Anticipated Discharge Location: Home  Barriers to Discharge: Acute ongoing Covid pneumonia  Is patient medically stable for Discharge: No, hopefully tomorrow.   Consultants:  None  Procedures:  None  Antimicrobials:  None   Exam:  General: Somewhat tired appearing young man sitting up in bed with tachypnea but no respiratory distress, mild flaring of nostrils.  Able to speak in full sentences with mild shortness of breath. Eyes: sclera anicteric, conjuctiva mild injection bilaterally CVS: S1-S2, regular  Respiratory: Reasonable air entry bilaterally without wheezes. GI: NABS, soft, NT  LE: No edema.  Neuro: A/O x 3, Moving all extremities equally with normal strength, CN 3-12 intact, grossly nonfocal.  Psych: patient is logical and coherent, judgement and insight appear normal, mood and affect appropriate to situation.   Data Reviewed: Basic Metabolic Panel: Recent Labs  Lab 03/09/20 1410 03/10/20 0524 03/11/20 0617  NA 137 140 137  K 3.8 4.5 4.5  CL 102 105 104  CO2 24 27 26   GLUCOSE 175* 129* 119*  BUN 20 18 22*  CREATININE 1.35*  1.32* 1.06  CALCIUM 8.4* 8.4* 8.6*  MG  --  2.3 2.4  PHOS  --  4.3 4.1   Liver Function Tests: Recent Labs  Lab 03/10/20 0524 03/11/20 0617  AST 25 27  ALT 31 37  ALKPHOS 42 43  BILITOT 0.7 0.9  PROT 7.2 7.2  ALBUMIN 3.1* 3.2*   No results for input(s): LIPASE, AMYLASE in the last 168 hours. No results for input(s): AMMONIA in the last 168 hours. CBC: Recent Labs  Lab 03/09/20 1410 03/10/20 0524 03/11/20 0617  WBC 9.4 10.5 9.8  NEUTROABS  --  9.2* 8.3*  HGB 14.3 13.8 14.4   HCT 45.4 44.2 47.0  MCV 86.5 87.2 89.0  PLT 91* 102* 135*   Cardiac Enzymes: No results for input(s): CKTOTAL, CKMB, CKMBINDEX, TROPONINI in the last 168 hours. BNP (last 3 results) No results for input(s): PROBNP in the last 8760 hours. CBG: No results for input(s): GLUCAP in the last 168 hours.  No results found for this or any previous visit (from the past 240 hour(s)).    Studies: CT Angio Chest PE W and/or Wo Contrast  Result Date: 03/09/2020 CLINICAL DATA:  Chest pain and shortness of breath for 3 days. EXAM: CT ANGIOGRAPHY CHEST WITH CONTRAST TECHNIQUE: Multidetector CT imaging of the chest was performed using the standard protocol during bolus administration of intravenous contrast. Multiplanar CT image reconstructions and MIPs were obtained to evaluate the vascular anatomy. CONTRAST:  56mL OMNIPAQUE IOHEXOL 350 MG/ML SOLN COMPARISON:  Two-view chest x-ray/5/1 FINDINGS: Cardiovascular: Heart size is normal. Aorta and great vessel origins are within limits. Pulmonary artery size is normal. Opacification is satisfactory. No focal filling defects are present to suggest pulmonary emboli. Mediastinum/Nodes: Subcentimeter para tracheal lymph nodes are present. Mild lymphoid prominence is present at the hila bilaterally. No significant axillary adenopathy is present. Lungs/Pleura: Patchy peripheral airspace disease is present in the lungs bilaterally. This is predominantly in the lower lobes. Significant airspace consolidation is present. No nodule or mass lesion is present. No pleural effusion or pneumothorax is present. Upper Abdomen: Visualized upper abdomen is within normal limits. Musculoskeletal: Vertebral body heights alignment are normal. No focal lytic or blastic lesions are present. Review of the MIP images confirms the above findings. IMPRESSION: 1. Multifocal ground-glass opacities, predominantly peripheral and greatest in the lower lobes, is concerning for multifocal pneumonia. Viral  pneumonia not excluded. 2. Subcentimeter paratracheal lymph nodes lymphoid tissue at the hila bilaterally is likely reactive. Electronically Signed   By: San Morelle M.D.   On: 03/09/2020 16:32     Scheduled Meds: . acetaminophen  1,000 mg Oral Once  . vitamin C  500 mg Oral Daily  . dexamethasone (DECADRON) injection  6 mg Intravenous Q24H  . Ipratropium-Albuterol  1 puff Inhalation Q6H  . zinc sulfate  220 mg Oral Daily   Continuous Infusions: . remdesivir 100 mg in NS 100 mL Stopped (03/11/20 1030)    Principal Problem:   Acute hypoxemic respiratory failure due to COVID-19 [facility]) Active Problems:   Thrombocytopenia ([place])     Emmarie Sannes [name], [facility]  If 7PM-7AM, please contact night-coverage [email or web address] Password [email or web address] 03/11/2020, 3:05 PM    LOS: 2 days

## 2020-03-12 LAB — COMPREHENSIVE METABOLIC PANEL
ALT: 77 U/L — ABNORMAL HIGH (ref 0–44)
AST: 49 U/L — ABNORMAL HIGH (ref 15–41)
Albumin: 3.1 g/dL — ABNORMAL LOW (ref 3.5–5.0)
Alkaline Phosphatase: 46 U/L (ref 38–126)
Anion gap: 9 (ref 5–15)
BUN: 23 mg/dL — ABNORMAL HIGH (ref 6–20)
CO2: 26 mmol/L (ref 22–32)
Calcium: 8.6 mg/dL — ABNORMAL LOW (ref 8.9–10.3)
Chloride: 103 mmol/L (ref 98–111)
Creatinine, Ser: 1.01 mg/dL (ref 0.61–1.24)
GFR calc Af Amer: >60 mL/min (ref >60)
GFR calc non Af Amer: >60 mL/min (ref >60)
Glucose, Bld: 112 mg/dL — ABNORMAL HIGH (ref 70–99)
Potassium: 4.4 mmol/L (ref 3.5–5.1)
Sodium: 138 mmol/L (ref 135–145)
Total Bilirubin: 0.9 mg/dL (ref 0.3–1.2)
Total Protein: 7 g/dL (ref 6.5–8.1)

## 2020-03-12 LAB — FIBRIN DERIVATIVES D-DIMER (ARMC ONLY): Fibrin derivatives D-dimer (ARMC): 913.91 ng/mL (FEU) — ABNORMAL HIGH (ref 0.00–499.00)

## 2020-03-12 LAB — CBC WITH DIFFERENTIAL/PLATELET
Abs Immature Granulocytes: 0.09 10*3/uL — ABNORMAL HIGH (ref 0.00–0.07)
Basophils Absolute: 0 10*3/uL (ref 0.0–0.1)
Basophils Relative: 0 %
Eosinophils Absolute: 0 10*3/uL (ref 0.0–0.5)
Eosinophils Relative: 0 %
HCT: 46.7 % (ref 39.0–52.0)
Hemoglobin: 14.5 g/dL (ref 13.0–17.0)
Immature Granulocytes: 1 %
Lymphocytes Relative: 14 %
Lymphs Abs: 1.2 10*3/uL (ref 0.7–4.0)
MCH: 27 pg (ref 26.0–34.0)
MCHC: 31 g/dL (ref 30.0–36.0)
MCV: 87 fL (ref 80.0–100.0)
Monocytes Absolute: 0.8 10*3/uL (ref 0.1–1.0)
Monocytes Relative: 9 %
Neutro Abs: 6.5 10*3/uL (ref 1.7–7.7)
Neutrophils Relative %: 76 %
Platelets: 158 10*3/uL (ref 150–400)
RBC: 5.37 MIL/uL (ref 4.22–5.81)
RDW: 13.2 % (ref 11.5–15.5)
Smear Review: NORMAL
WBC: 8.6 10*3/uL (ref 4.0–10.5)
nRBC: 0 % (ref 0.0–0.2)

## 2020-03-12 LAB — FERRITIN: Ferritin: 466 ng/mL — ABNORMAL HIGH (ref 24–336)

## 2020-03-12 LAB — PHOSPHORUS: Phosphorus: 3.7 mg/dL (ref 2.5–4.6)

## 2020-03-12 LAB — C-REACTIVE PROTEIN: CRP: 1.5 mg/dL — ABNORMAL HIGH (ref <1.0)

## 2020-03-12 LAB — MAGNESIUM: Magnesium: 2.3 mg/dL (ref 1.7–2.4)

## 2020-03-12 MED ORDER — ALBUTEROL SULFATE HFA 108 (90 BASE) MCG/ACT IN AERS
2.0000 | INHALATION_SPRAY | RESPIRATORY_TRACT | Status: DC | PRN
  Administered 2020-03-13: 2 via RESPIRATORY_TRACT
  Filled 2020-03-12: qty 6.7

## 2020-03-12 MED ORDER — ENOXAPARIN SODIUM 40 MG/0.4ML ~~LOC~~ SOLN
40.0000 mg | SUBCUTANEOUS | Status: DC
  Administered 2020-03-12: 40 mg via SUBCUTANEOUS
  Filled 2020-03-12: qty 0.4

## 2020-03-12 NOTE — Progress Notes (Signed)
PROGRESS NOTE    Charles West  Charles West  DOB: 02/13/1976  DOA: 03/09/2020 PCP: Baxter Hire, MD Outpatient Specialists:   Hospital course:  44 year old otherwise healthy man was diagnosed with COVID-19 infection 03/02/2020.  He was started on steroids and albuterol on 03/05/2020 however had at home he had fevers to 102, ongoing sore throat, worsening shortness of breath, worsened cough productive of brown phlegm.  He was seen in ED on and was admitted on 03/09/2020 with O2 saturations 90% on room air with ambulation.  Chest x-ray did not show any focal consolidation or edema however chest CTA showed multifocal groundglass opacities consistent with Covid pneumonia.   Subjective:  Patient states that he generally feels okay however cough has returned and is somewhat worse.  Notes that he is coughing with speaking and anytime he has to take a deep breath.  It is productive of clear phlegm.  Notes this is worse than yesterday.  Objective: Vitals:   03/11/20 1545 03/11/20 1712 03/11/20 2106 03/12/20 0818  BP: 133/70  115/76 109/66  Pulse: 68 90  68  Resp: (!) 22 19  19   Temp: 98.2 F (36.8 C)  98.5 F (36.9 C) 97.8 F (36.6 C)  TempSrc: Oral  Oral Oral  SpO2: 96% 95%  100%  Weight:      Height:        Intake/Output Summary (Last 24 hours) at 03/12/2020 1433 Last data filed at 03/12/2020 1203 Gross per 24 hour  Intake 100 ml  Output --  Net 100 ml   Filed Weights   03/09/20 1401  Weight: 91.2 kg     Assessment & Plan:   Acute hypoxemic respiratory failure due to COVID-19 pneumonia: Patient has re-developed bronchospasm, somewhat worse than previously. Oxygenation is good, now 100% on room air and lungs are clear however patient unable to take deep breaths without spasms of coughing.  As noted above he also coughs with speaking long sentences. We will continue to observe patient overnight to make sure he does not decompensate further. 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     DVT prophylaxis: SCDs given platelets 91K Code Status: Full code Family Communication: Patient is in close contact with his family Disposition Plan:   Patient is from: Home  Anticipated Discharge Location: Home  Barriers to Discharge: Recrudescence of bronchospasm.  Is patient medically stable for Discharge: No, would like to watch overnight to make sure he does not decompensate further.  Consultants:  None  Procedures:  None  Antimicrobials:  None   Exam:  General: Patient appears well with no respiratory distress, no tachypnea.  However when patient starts talking he develops a bronchospastic sounding cough. Eyes: sclera anicteric, conjuctiva mild injection bilaterally CVS: S1-S2, regular  Respiratory: Good air entry bilaterally however patient unable to take deep breath without significant episodes of coughing. GI: NABS, soft, NT  LE: No edema.  Neuro: A/O x 3, Moving all extremities equally with normal strength, CN 3-12 intact, grossly nonfocal.  Psych: patient is logical and coherent, judgement and insight appear normal, mood and affect appropriate to situation.   Data Reviewed: Basic Metabolic Panel: Recent Labs  Lab 03/09/20 1410 03/10/20 0524 03/11/20 0617 03/12/20 0612  NA 137 140 137 138  K 3.8 4.5 4.5 4.4  CL 102 105  104 103  CO2 24 27 26 26   GLUCOSE 175* 129* 119* 112*  BUN 20 18 22* 23*  CREATININE 1.35* 1.32* 1.06 1.01  CALCIUM 8.4* 8.4* 8.6* 8.6*  MG  --  2.3 2.4 2.3  PHOS  --  4.3 4.1 3.7   Liver Function Tests: Recent Labs  Lab 03/10/20 0524 03/11/20 0617 03/12/20 0612  AST 25 27 49*  ALT 31 37 77*  ALKPHOS 42 43 46  BILITOT 0.7 0.9 0.9  PROT 7.2 7.2 7.0  ALBUMIN 3.1* 3.2* 3.1*   No results for input(s): LIPASE,  AMYLASE in the last 168 hours. No results for input(s): AMMONIA in the last 168 hours. CBC: Recent Labs  Lab 03/09/20 1410 03/10/20 0524 03/11/20 0617 03/12/20 0612  WBC 9.4 10.5 9.8 8.6  NEUTROABS  --  9.2* 8.3* 6.5  HGB 14.3 13.8 14.4 14.5  HCT 45.4 44.2 47.0 46.7  MCV 86.5 87.2 89.0 87.0  PLT 91* 102* 135* 158   Cardiac Enzymes: No results for input(s): CKTOTAL, CKMB, CKMBINDEX, TROPONINI in the last 168 hours. BNP (last 3 results) No results for input(s): PROBNP in the last 8760 hours. CBG: No results for input(s): GLUCAP in the last 168 hours.  No results found for this or any previous visit (from the past 240 hour(s)).    Studies: No results found.   Scheduled Meds: . acetaminophen  1,000 mg Oral Once  . vitamin C  500 mg Oral Daily  . dexamethasone (DECADRON) injection  6 mg Intravenous Q24H  . enoxaparin (LOVENOX) injection  40 mg Subcutaneous Q24H  . Ipratropium-Albuterol  1 puff Inhalation Q6H  . zinc sulfate  220 mg Oral Daily   Continuous Infusions: . remdesivir 100 mg in NS 100 mL Stopped (03/12/20 1036)    Principal Problem:   Acute hypoxemic respiratory failure due to COVID-19 [facility]) Active Problems:   Thrombocytopenia ([place])     Charles West [name], [facility]  If 7PM-7AM, please contact night-coverage [email or web address] Password [facility] 03/12/2020, 2:33 PM    LOS: 3 days

## 2020-03-12 NOTE — TOC Initial Note (Signed)
Transition of Care [facility]) - Initial/Assessment Note    Patient Details  Name: Charles West MRN: [id number] Date of Birth: 1976-10-23  Transition of Care [facility]) CM/SW Contact:    Shelbie Ammons, RN Phone Number: 03/12/2020, 11:35 AM  Clinical Narrative:      RNCM contacted patient by telephone for assessment. Discussed CM role and patient was agreeable to talk.  Patient reports that he lives at home independently and does not have any needs aside from right now he does not have transportation. Patient reports that the MD saw him today and he will likely go home tomorrow. Patient reports he is still unsure if he will be able to arrange transportation. Discussed that this CM will follow up with him tomorrow and will work on arranging transportation if need be.             Expected Discharge Plan: Home/Self Care Barriers to Discharge: Transportation   Patient Goals and CMS Choice        Expected Discharge Plan and Services Expected Discharge Plan: Home/Self Care   Discharge Planning Services: CM Consult   Living arrangements for the past 2 months: Single Family Home                                      Prior Living Arrangements/Services Living arrangements for the past 2 months: Single Family Home Lives with:: Spouse Patient language and need for interpreter reviewed:: Yes Do you feel safe going back to the place where you live?: Yes      Need for Family Participation in Patient Care: Yes (Comment) Care giver support system in place?: Yes (comment)   Criminal Activity/Legal Involvement Pertinent to Current Situation/Hospitalization: No - Comment as needed  Activities of Daily Living Home Assistive Devices/Equipment: None ADL Screening (condition at time of admission) Patient's cognitive ability adequate to safely complete daily activities?: Yes Is the patient deaf or have difficulty hearing?: No Does the patient have difficulty seeing, even when wearing  glasses/contacts?: No Does the patient have difficulty concentrating, remembering, or making decisions?: No Patient able to express need for assistance with ADLs?: Yes Does the patient have difficulty dressing or bathing?: No Independently performs ADLs?: Yes (appropriate for developmental age) Does the patient have difficulty walking or climbing stairs?: No Weakness of Legs: None Weakness of Arms/Hands: None  Permission Sought/Granted                  Emotional Assessment       Orientation: : Oriented to Self, Oriented to Place, Oriented to  Time, Oriented to Situation Alcohol / Substance Use: Not Applicable Psych Involvement: No (comment)  Admission diagnosis:  Acute respiratory failure with hypoxia ([place]) [J96.01] Acute hypoxemic respiratory failure due to COVID-19 ([place]) [U07.1, J96.01] Pneumonia due to COVID-19 virus [U07.1, J12.82] Patient Active Problem List   Diagnosis Date Noted  . Acute hypoxemic respiratory failure due to COVID-19 ([place]) 03/09/2020  . Thrombocytopenia ([place]) 03/09/2020   PCP:  Baxter Hire, MD Pharmacy:   [facility] [id number], [place] [address] Phone: [phone] Fax: [place], [place] [facility] [address] [place] [facility] [place] [address] Phone: [phone] Fax: [phone]     Social Determinants of  Health (SDOH) Interventions    Readmission Risk Interventions No flowsheet data found.

## 2020-03-13 LAB — COMPREHENSIVE METABOLIC PANEL
ALT: 114 U/L — ABNORMAL HIGH (ref 0–44)
AST: 52 U/L — ABNORMAL HIGH (ref 15–41)
Albumin: 3.1 g/dL — ABNORMAL LOW (ref 3.5–5.0)
Alkaline Phosphatase: 45 U/L (ref 38–126)
Anion gap: 7 (ref 5–15)
BUN: 21 mg/dL — ABNORMAL HIGH (ref 6–20)
CO2: 24 mmol/L (ref 22–32)
Calcium: 8.7 mg/dL — ABNORMAL LOW (ref 8.9–10.3)
Chloride: 106 mmol/L (ref 98–111)
Creatinine, Ser: 0.98 mg/dL (ref 0.61–1.24)
GFR calc Af Amer: >60 mL/min (ref >60)
GFR calc non Af Amer: >60 mL/min (ref >60)
Glucose, Bld: 86 mg/dL (ref 70–99)
Potassium: 4.3 mmol/L (ref 3.5–5.1)
Sodium: 137 mmol/L (ref 135–145)
Total Bilirubin: 0.9 mg/dL (ref 0.3–1.2)
Total Protein: 6.8 g/dL (ref 6.5–8.1)

## 2020-03-13 LAB — CBC WITH DIFFERENTIAL/PLATELET
Abs Immature Granulocytes: 0.12 10*3/uL — ABNORMAL HIGH (ref 0.00–0.07)
Basophils Absolute: 0 10*3/uL (ref 0.0–0.1)
Basophils Relative: 1 %
Eosinophils Absolute: 0 10*3/uL (ref 0.0–0.5)
Eosinophils Relative: 0 %
HCT: 48.6 % (ref 39.0–52.0)
Hemoglobin: 15.3 g/dL (ref 13.0–17.0)
Immature Granulocytes: 2 %
Lymphocytes Relative: 18 %
Lymphs Abs: 1.2 10*3/uL (ref 0.7–4.0)
MCH: 27.6 pg (ref 26.0–34.0)
MCHC: 31.5 g/dL (ref 30.0–36.0)
MCV: 87.6 fL (ref 80.0–100.0)
Monocytes Absolute: 0.6 10*3/uL (ref 0.1–1.0)
Monocytes Relative: 9 %
Neutro Abs: 5 10*3/uL (ref 1.7–7.7)
Neutrophils Relative %: 70 %
Platelets: 134 10*3/uL — ABNORMAL LOW (ref 150–400)
RBC: 5.55 MIL/uL (ref 4.22–5.81)
RDW: 13 % (ref 11.5–15.5)
WBC Morphology: ABNORMAL
WBC: 7 10*3/uL (ref 4.0–10.5)
nRBC: 0 % (ref 0.0–0.2)

## 2020-03-13 LAB — FERRITIN: Ferritin: 483 ng/mL — ABNORMAL HIGH (ref 24–336)

## 2020-03-13 LAB — MAGNESIUM: Magnesium: 2.3 mg/dL (ref 1.7–2.4)

## 2020-03-13 LAB — PHOSPHORUS: Phosphorus: 3.3 mg/dL (ref 2.5–4.6)

## 2020-03-13 LAB — FIBRIN DERIVATIVES D-DIMER (ARMC ONLY): Fibrin derivatives D-dimer (ARMC): 638.97 ng/mL (FEU) — ABNORMAL HIGH (ref 0.00–499.00)

## 2020-03-13 LAB — C-REACTIVE PROTEIN: CRP: 0.7 mg/dL (ref <1.0)

## 2020-03-13 MED ORDER — IPRATROPIUM-ALBUTEROL 20-100 MCG/ACT IN AERS: 1.0000 | INHALATION_SPRAY | Freq: Four times a day (QID) | RESPIRATORY_TRACT | 0 refills | Status: DC

## 2020-03-13 MED ORDER — PREDNISONE 20 MG PO TABS: 40.0000 mg | ORAL_TABLET | Freq: Every day | ORAL | 0 refills | Status: AC

## 2020-03-13 NOTE — Discharge Summary (Signed)
Charles West [id number] DOB: [birth year] DOA: 03/09/2020  PCP: Baxter Hire, MD  Admit date: 03/09/2020  Discharge date: 03/13/2020  Admitted From: Home   disposition: Home   Recommendations for Outpatient Follow-up:   Follow up with PCP in 1-2 weeks  Home Health: N/A Equipment/Devices: N/A Consultations: None Discharge Condition: Improved CODE STATUS: Full Diet Recommendation: Regular  Diet Order            Diet general        Diet regular Room service appropriate? Yes; Fluid consistency: Thin  Diet effective now               Chief Complaint  Patient presents with  . Shortness of Breath     Brief history of present illness from the day of admission and additional interim summary     44 year old otherwise healthy man was diagnosed with COVID-19 infection 03/02/2020.  At home however home he had fevers to 102, ongoing sore throat, worsening shortness of breath, worsened cough productive of brown phlegm and was started on steroids and albuterol inhaler as an outpatient on 03/05/2020.  Symptoms however continue to worsen so he presented to the ED and was admitted on 03/09/2020 with O2 saturations 90% on room air with ambulation.  Chest x-ray did not show any focal consolidation or edema however chest CTA ruled out PE however showed multifocal groundglass opacities predominantly peripheral and greatest in the lower lobes consistent with Covid pneumonia.  He was noted to have mild acute kidney injury on admission with creatinine of 1.3 on admission.  Initial CRP was 13 with pro calcitonin of 0.47.                                                                  Hospital Course   At admission, patient was started on remdesivir and restarted on steroids dexamethasone 6 mg IV daily.  Patient slowly improved  over the course of the hospital stay.  Acute kidney injury resolved with treatment with discharge creatinine of 0.98.  His oxygen requirement had resolved by the third hospital day however patient had developed significant bronchospasm which manifested as coughing with minimal exertion and with deep inspiration.  Patient was treated with Combivent inhaler and as needed albuterol inhaler.  By the fifth day after admission patient was feeling much better with increased exercise tolerance and much decrease in his cough.  Patient is completed his 5-day course of remdesivir.  He is discharged home on albuterol, Combivent and to complete another 5 days of dexamethasone.  Patient is instructed to follow-up with his PCP within a week of discharge to ensure that he is continuing to improve and is not having any ongoing adverse effects from SARS-CoV-2 infection and steroid use.    Discharge diagnosis  Principal Problem:   Acute hypoxemic respiratory failure due to COVID-19 [facility]) Active Problems:   Thrombocytopenia ([place])    Discharge instructions    Discharge Instructions    Call MD for:  difficulty breathing, headache or visual disturbances   Complete by: As directed    Call MD for:  persistant nausea and vomiting   Complete by: As directed    Diet general   Complete by: As directed    Increase activity slowly   Complete by: As directed    [place] COVID-19 home monitoring program   Complete by: Mar 13, 2020    Is the patient willing to use the [place] for home monitoring?: No      Discharge Medications   Allergies as of 03/13/2020   No Known Allergies     Medication List    TAKE these medications   albuterol 108 (90 Base) MCG/ACT inhaler Commonly known as: VENTOLIN HFA Inhale 2 puffs into the lungs every 4 (four) hours as needed.   benzonatate 200 MG capsule Commonly known as: TESSALON Take 1 capsule by mouth 3 (three) times daily as needed for cough.     Ipratropium-Albuterol 20-100 MCG/ACT Aers respimat Commonly known as: COMBIVENT Inhale 1 puff into the lungs every 6 (six) hours.   predniSONE 20 MG tablet Commonly known as: DELTASONE Take 2 tablets (40 mg total) by mouth daily for 5 days. What changed: how much to take       Follow-up Information    Baxter Hire, MD. Schedule an appointment as soon as possible for a visit in 1 week(s).   Specialty: Internal Medicine Contact information: [place] [address] [phone]           Major procedures and Radiology Reports - PLEASE review detailed and final reports thoroughly  -      DG Chest 2 View  Result Date: 03/09/2020 CLINICAL DATA:  Shortness of breath EXAM: CHEST - 2 VIEW COMPARISON:  October 19, 2019 FINDINGS: Degree of inspiration is shallow. Lungs are clear. Heart size and pulmonary vascularity are normal. No adenopathy. No bone lesions. IMPRESSION: No edema or consolidation.  Cardiac silhouette within normal limits. Electronically Signed   By: Lowella Grip III M.D.   On: 03/09/2020 14:39   CT Angio Chest PE W and/or Wo Contrast  Result Date: 03/09/2020 CLINICAL DATA:  Chest pain and shortness of breath for 3 days. EXAM: CT ANGIOGRAPHY CHEST WITH CONTRAST TECHNIQUE: Multidetector CT imaging of the chest was performed using the standard protocol during bolus administration of intravenous contrast. Multiplanar CT image reconstructions and MIPs were obtained to evaluate the vascular anatomy. CONTRAST:  19mL OMNIPAQUE IOHEXOL 350 MG/ML SOLN COMPARISON:  Two-view chest x-ray/5/1 FINDINGS: Cardiovascular: Heart size is normal. Aorta and great vessel origins are within limits. Pulmonary artery size is normal. Opacification is satisfactory. No focal filling defects are present to suggest pulmonary emboli. Mediastinum/Nodes: Subcentimeter para tracheal lymph nodes are present. Mild lymphoid prominence is present at the hila bilaterally. No significant  axillary adenopathy is present. Lungs/Pleura: Patchy peripheral airspace disease is present in the lungs bilaterally. This is predominantly in the lower lobes. Significant airspace consolidation is present. No nodule or mass lesion is present. No pleural effusion or pneumothorax is present. Upper Abdomen: Visualized upper abdomen is within normal limits. Musculoskeletal: Vertebral body heights alignment are normal. No focal lytic or blastic lesions are present. Review of the MIP images confirms the above findings. IMPRESSION: 1. Multifocal ground-glass opacities,  predominantly peripheral and greatest in the lower lobes, is concerning for multifocal pneumonia. Viral pneumonia not excluded. 2. Subcentimeter paratracheal lymph nodes lymphoid tissue at the hila bilaterally is likely reactive. Electronically Signed   By: San Morelle M.D.   On: 03/09/2020 16:32    Micro Results    No results found for this or any previous visit (from the past 240 hour(s)).  Today   Subjective    Charles West today feels much improved today and much better than yesterday.  Notes that his cough is much better is able to ambulate with only minimal cough.  Has no shortness of breath.  Objective   Blood pressure 104/61, pulse (!) 56, temperature 98 F (36.7 C), temperature source Oral, resp. rate 16, height 5\' 11"  (1.803 m), weight 91.2 kg, SpO2 97 %.   Intake/Output Summary (Last 24 hours) at 03/13/2020 1010 Last data filed at 03/12/2020 1203 Gross per 24 hour  Intake 100 ml  Output --  Net 100 ml    Exam General: Patient appears well with no respiratory distress, no tachypnea.  However when patient starts talking he develops a bronchospastic sounding cough. Eyes: sclera anicteric, conjuctiva mild injection bilaterally CVS: S1-S2, regular  Respiratory: Good air entry bilaterally however patient unable to take deep breath without significant episodes of coughing. GI: NABS, soft, NT  LE: No edema.   Neuro: A/O x 3, Moving all extremities equally with normal strength, CN 3-12 intact, grossly nonfocal.  Psych: patient is logical and coherent, judgement and insight appear normal, mood and affect appropriate to situation.   Data Review   CBC w Diff:  Lab Results  Component Value Date   WBC 8.6 03/12/2020   HGB 14.5 03/12/2020   HCT 46.7 03/12/2020   PLT 158 03/12/2020   LYMPHOPCT 14 03/12/2020   MONOPCT 9 03/12/2020   EOSPCT 0 03/12/2020   BASOPCT 0 03/12/2020    CMP:  Lab Results  Component Value Date   NA 137 03/13/2020   K 4.3 03/13/2020   CL 106 03/13/2020   CO2 24 03/13/2020   BUN 21 (H) 03/13/2020   CREATININE 0.98 03/13/2020   PROT 6.8 03/13/2020   ALBUMIN 3.1 (L) 03/13/2020   BILITOT 0.9 03/13/2020   ALKPHOS 45 03/13/2020   AST 52 (H) 03/13/2020   ALT 114 (H) 03/13/2020  .   Total Time in preparing paper work, data evaluation and todays exam - 35 minutes  Vashti Hey M.D on 03/13/2020 at 10:10 AM  [facility]   Office  [phone]

## 2020-03-13 NOTE — TOC Transition Note (Signed)
Transition of Care [facility]) - CM/SW Discharge Note   Patient Details  Name: Wwilliam Biscardi MRN: [id number] Date of Birth: [birth year]  Transition of Care [facility]) CM/SW Contact:  Shelbie Ammons, RN Phone Number: 03/13/2020, 11:23 AM   Clinical Narrative: RNCM contacted patient, his wife will transport home, no further needs identified.         Barriers to Discharge: Transportation   Patient Goals and CMS Choice        Discharge Placement                       Discharge Plan and Services   Discharge Planning Services: CM Consult                                 Social Determinants of Health (SDOH) Interventions     Readmission Risk Interventions No flowsheet data found.

## 2020-03-13 NOTE — Discharge Instructions (Signed)
 COVID-19 COVID-19 is a respiratory infection that is caused by a virus called severe acute respiratory syndrome coronavirus 2 (SARS-CoV-2). The disease is also known as coronavirus disease or novel coronavirus. In some people, the virus may not cause any symptoms. In others, it may cause a serious infection. The infection can get worse quickly and can lead to complications, such as:  Pneumonia, or infection of the lungs.  Acute respiratory distress syndrome or ARDS. This is a condition in which fluid build-up in the lungs prevents the lungs from filling with air and passing oxygen into the blood.  Acute respiratory failure. This is a condition in which there is not enough oxygen passing from the lungs to the body or when carbon dioxide is not passing from the lungs out of the body.  Sepsis or septic shock. This is a serious bodily reaction to an infection.  Blood clotting problems.  Secondary infections due to bacteria or fungus.  Organ failure. This is when your body's organs stop working. The virus that causes COVID-19 is contagious. This means that it can spread from person to person through droplets from coughs and sneezes (respiratory secretions). What are the causes? This illness is caused by a virus. You may catch the virus by:  Breathing in droplets from an infected person. Droplets can be spread by a person breathing, speaking, singing, coughing, or sneezing.  Touching something, like a table or a doorknob, that was exposed to the virus (contaminated) and then touching your mouth, nose, or eyes. What increases the risk? Risk for infection You are more likely to be infected with this virus if you:  Are within 6 feet (2 meters) of a person with COVID-19.  Provide care for or live with a person who is infected with COVID-19.  Spend time in crowded indoor spaces or live in shared housing. Risk for serious illness You are more likely to become seriously ill from the virus if  you:  Are 50 years of age or older. The higher your age, the more you are at risk for serious illness.  Live in a nursing home or long-term care facility.  Have cancer.  Have a long-term (chronic) disease such as: ? Chronic lung disease, including chronic obstructive pulmonary disease or asthma. ? A long-term disease that lowers your body's ability to fight infection (immunocompromised). ? Heart disease, including heart failure, a condition in which the arteries that lead to the heart become narrow or blocked (coronary artery disease), a disease which makes the heart muscle thick, weak, or stiff (cardiomyopathy). ? Diabetes. ? Chronic kidney disease. ? Sickle cell disease, a condition in which red blood cells have an abnormal "sickle" shape. ? Liver disease.  Are obese. What are the signs or symptoms? Symptoms of this condition can range from mild to severe. Symptoms may appear any time from 2 to 14 days after being exposed to the virus. They include:  A fever or chills.  A cough.  Difficulty breathing.  Headaches, body aches, or muscle aches.  Runny or stuffy (congested) nose.  A sore throat.  New loss of taste or smell. Some people may also have stomach problems, such as nausea, vomiting, or diarrhea. Other people may not have any symptoms of COVID-19. How is this diagnosed? This condition may be diagnosed based on:  Your signs and symptoms, especially if: ? You live in an area with a COVID-19 outbreak. ? You recently traveled to or from an area where the virus is common. ?   You provide care for or live with a person who was diagnosed with COVID-19. ? You were exposed to a person who was diagnosed with COVID-19.  A physical exam.  Lab tests, which may include: ? Taking a sample of fluid from the back of your nose and throat (nasopharyngeal fluid), your nose, or your throat using a swab. ? A sample of mucus from your lungs (sputum). ? Blood tests.  Imaging tests,  which may include, X-rays, CT scan, or ultrasound. How is this treated? At present, there is no medicine to treat COVID-19. Medicines that treat other diseases are being used on a trial basis to see if they are effective against COVID-19. Your health care provider will talk with you about ways to treat your symptoms. For most people, the infection is mild and can be managed at home with rest, fluids, and over-the-counter medicines. Treatment for a serious infection usually takes places in a hospital intensive care unit (ICU). It may include one or more of the following treatments. These treatments are given until your symptoms improve.  Receiving fluids and medicines through an IV.  Supplemental oxygen. Extra oxygen is given through a tube in the nose, a face mask, or a hood.  Positioning you to lie on your stomach (prone position). This makes it easier for oxygen to get into the lungs.  Continuous positive airway pressure (CPAP) or bi-level positive airway pressure (BPAP) machine. This treatment uses mild air pressure to keep the airways open. A tube that is connected to a motor delivers oxygen to the body.  Ventilator. This treatment moves air into and out of the lungs by using a tube that is placed in your windpipe.  Tracheostomy. This is a procedure to create a hole in the neck so that a breathing tube can be inserted.  Extracorporeal membrane oxygenation (ECMO). This procedure gives the lungs a chance to recover by taking over the functions of the heart and lungs. It supplies oxygen to the body and removes carbon dioxide. Follow these instructions at home: Lifestyle  If you are sick, stay home except to get medical care. Your health care provider will tell you how long to stay home. Call your health care provider before you go for medical care.  Rest at home as told by your health care provider.  Do not use any products that contain nicotine or tobacco, such as cigarettes,  e-cigarettes, and chewing tobacco. If you need help quitting, ask your health care provider.  Return to your normal activities as told by your health care provider. Ask your health care provider what activities are safe for you. General instructions  Take over-the-counter and prescription medicines only as told by your health care provider.  Drink enough fluid to keep your urine pale yellow.  Keep all follow-up visits as told by your health care provider. This is important. How is this prevented?  There is no vaccine to help prevent COVID-19 infection. However, there are steps you can take to protect yourself and others from this virus. To protect yourself:   Do not travel to areas where COVID-19 is a risk. The areas where COVID-19 is reported change often. To identify high-risk areas and travel restrictions, check the CDC travel website: wwwnc.cdc.gov/travel/notices  If you live in, or must travel to, an area where COVID-19 is a risk, take precautions to avoid infection. ? Stay away from people who are sick. ? Wash your hands often with soap and water for 20 seconds. If soap and   water are not available, use an alcohol-based hand sanitizer. ? Avoid touching your mouth, face, eyes, or nose. ? Avoid going out in public, follow guidance from your state and local health authorities. ? If you must go out in public, wear a cloth face covering or face mask. Make sure your mask covers your nose and mouth. ? Avoid crowded indoor spaces. Stay at least 6 feet (2 meters) away from others. ? Disinfect objects and surfaces that are frequently touched every day. This may include:  Counters and tables.  Doorknobs and light switches.  Sinks and faucets.  Electronics, such as phones, remote controls, keyboards, computers, and tablets. To protect others: If you have symptoms of COVID-19, take steps to prevent the virus from spreading to others.  If you think you have a COVID-19 infection, contact  your health care provider right away. Tell your health care team that you think you may have a COVID-19 infection.  Stay home. Leave your house only to seek medical care. Do not use public transport.  Do not travel while you are sick.  Wash your hands often with soap and water for 20 seconds. If soap and water are not available, use alcohol-based hand sanitizer.  Stay away from other members of your household. Let healthy household members care for children and pets, if possible. If you have to care for children or pets, wash your hands often and wear a mask. If possible, stay in your own room, separate from others. Use a different bathroom.  Make sure that all people in your household wash their hands well and often.  Cough or sneeze into a tissue or your sleeve or elbow. Do not cough or sneeze into your hand or into the air.  Wear a cloth face covering or face mask. Make sure your mask covers your nose and mouth. Where to find more information  Centers for Disease Control and Prevention: www.cdc.gov/coronavirus/2019-ncov/index.html  World Health Organization: www.who.int/health-topics/coronavirus Contact a health care provider if:  You live in or have traveled to an area where COVID-19 is a risk and you have symptoms of the infection.  You have had contact with someone who has COVID-19 and you have symptoms of the infection. Get help right away if:  You have trouble breathing.  You have pain or pressure in your chest.  You have confusion.  You have bluish lips and fingernails.  You have difficulty waking from sleep.  You have symptoms that get worse. These symptoms may represent a serious problem that is an emergency. Do not wait to see if the symptoms will go away. Get medical help right away. Call your local emergency services (911 in the [place] Do not drive yourself to the hospital. Let the emergency medical personnel know if you think you have  COVID-19. Summary  COVID-19 is a respiratory infection that is caused by a virus. It is also known as coronavirus disease or novel coronavirus. It can cause serious infections, such as pneumonia, acute respiratory distress syndrome, acute respiratory failure, or sepsis.  The virus that causes COVID-19 is contagious. This means that it can spread from person to person through droplets from breathing, speaking, singing, coughing, or sneezing.  You are more likely to develop a serious illness if you are 50 years of age or older, have a weak immune system, live in a nursing home, or have chronic disease.  There is no medicine to treat COVID-19. Your health care provider will talk with you about ways to treat your   Take steps to protect yourself and others from infection. Wash your hands often and disinfect objects and surfaces that are frequently touched every day. Stay away from people who are sick and wear a mask if you are sick. This information is not intended to replace advice given to you by your health care provider. Make sure you discuss any questions you have with your health care provider. Document Revised: 09/20/2019 Document Reviewed: 12/27/2018 Elsevier Patient Education  2020 [employer]   COVID-19 Frequently Asked Questions COVID-19 (coronavirus disease) is an infection that is caused by a large family of viruses. Some viruses cause illness in people and others cause illness in animals like camels, cats, and bats. In some cases, the viruses that cause illness in animals can spread to humans. Where did the coronavirus come from? In December 2019, [place] told the World Health Organization (WHO) of several cases of lung disease (human respiratory illness). These cases were linked to an open seafood and livestock market in the city of [place]. The link to the seafood and livestock market suggests that the virus may have spread from animals to humans. However, since that first  outbreak in December, the virus has also been shown to spread from person to person. What is the name of the disease and the virus? Disease name Early on, this disease was called novel coronavirus. This is because scientists determined that the disease was caused by a new (novel) respiratory virus. The World Health Organization (WHO) has now named the disease COVID-19, or coronavirus disease. Virus name The virus that causes the disease is called severe acute respiratory syndrome coronavirus 2 (SARS-CoV-2). More information on disease and virus naming World Health Organization (WHO): www.who.int/emergencies/diseases/novel-coronavirus-2019/technical-guidance/naming-the-coronavirus-disease-(covid-2019)-and-the-virus-that-causes-it Who is at risk for complications from coronavirus disease? Some people may be at higher risk for complications from coronavirus disease. This includes older adults and people who have chronic diseases, such as heart disease, diabetes, and lung disease. If you are at higher risk for complications, take these extra precautions:  Stay home as much as possible.  Avoid social gatherings and travel.  Avoid close contact with others. Stay at least 6 ft (2 m) away from others, if possible.  Wash your hands often with soap and water for at least 20 seconds.  Avoid touching your face, mouth, nose, or eyes.  Keep supplies on hand at home, such as food, medicine, and cleaning supplies.  If you must go out in public, wear a cloth face covering or face mask. Make sure your mask covers your nose and mouth. How does coronavirus disease spread? The virus that causes coronavirus disease spreads easily from person to person (is contagious). You may catch the virus by:  Breathing in droplets from an infected person. Droplets can be spread by a person breathing, speaking, singing, coughing, or sneezing.  Touching something, like a table or a doorknob, that was exposed to the virus  (contaminated) and then touching your mouth, nose, or eyes. Can I get the virus from touching surfaces or objects? There is still a lot that we do not know about the virus that causes coronavirus disease. Scientists are basing a lot of information on what they know about similar viruses, such as:  Viruses cannot generally survive on surfaces for long. They need a human body (host) to survive.  It is more likely that the virus is spread by close contact with people who are sick (direct contact), such as through: ? Shaking hands or hugging. ? Breathing in respiratory droplets   that travel through the air. Droplets can be spread by a person breathing, speaking, singing, coughing, or sneezing.  It is less likely that the virus is spread when a person touches a surface or object that has the virus on it (indirect contact). The virus may be able to enter the body if the person touches a surface or object and then touches his or her face, eyes, nose, or mouth. Can a person spread the virus without having symptoms of the disease? It may be possible for the virus to spread before a person has symptoms of the disease, but this is most likely not the main way the virus is spreading. It is more likely for the virus to spread by being in close contact with people who are sick and breathing in the respiratory droplets spread by a person breathing, speaking, singing, coughing, or sneezing. What are the symptoms of coronavirus disease? Symptoms vary from person to person and can range from mild to severe. Symptoms may include:  Fever or chills.  Cough.  Difficulty breathing or feeling short of breath.  Headaches, body aches, or muscle aches.  Runny or stuffy (congested) nose.  Sore throat.  New loss of taste or smell.  Nausea, vomiting, or diarrhea. These symptoms can appear anywhere from 2 to 14 days after you have been exposed to the virus. Some people may not have any symptoms. If you develop  symptoms, call your health care provider. People with severe symptoms may need hospital care. Should I be tested for this virus? Your health care provider will decide whether to test you based on your symptoms, history of exposure, and your risk factors. How does a health care provider test for this virus? Health care providers will collect samples to send for testing. Samples may include:  Taking a swab of fluid from the back of your nose and throat, your nose, or your throat.  Taking fluid from the lungs by having you cough up mucus (sputum) into a sterile cup.  Taking a blood sample. Is there a treatment or vaccine for this virus? Currently, there is no vaccine to prevent coronavirus disease. Also, there are no medicines like antibiotics or antivirals to treat the virus. A person who becomes sick is given supportive care, which means rest and fluids. A person may also relieve his or her symptoms by using over-the-counter medicines that treat sneezing, coughing, and runny nose. These are the same medicines that a person takes for the common cold. If you develop symptoms, call your health care provider. People with severe symptoms may need hospital care. What can I do to protect myself and my family from this virus?     You can protect yourself and your family by taking the same actions that you would take to prevent the spread of other viruses. Take the following actions:  Wash your hands often with soap and water for at least 20 seconds. If soap and water are not available, use alcohol-based hand sanitizer.  Avoid touching your face, mouth, nose, or eyes.  Cough or sneeze into a tissue, sleeve, or elbow. Do not cough or sneeze into your hand or the air. ? If you cough or sneeze into a tissue, throw it away immediately and wash your hands.  Disinfect objects and surfaces that you frequently touch every day.  Stay away from people who are sick.  Avoid going out in public, follow  guidance from your state and local health authorities.  Avoid crowded indoor spaces.   Stay at least 6 ft (2 m) away from others.  If you must go out in public, wear a cloth face covering or face mask. Make sure your mask covers your nose and mouth.  Stay home if you are sick, except to get medical care. Call your health care provider before you get medical care. Your health care provider will tell you how long to stay home.  Make sure your vaccines are up to date. Ask your health care provider what vaccines you need. What should I do if I need to travel? Follow travel recommendations from your local health authority, the CDC, and WHO. Travel information and advice  Centers for Disease Control and Prevention (CDC): www.cdc.gov/coronavirus/2019-ncov/travelers/index.html  World Health Organization (WHO): www.who.int/emergencies/diseases/novel-coronavirus-2019/travel-advice Know the risks and take action to protect your health  You are at higher risk of getting coronavirus disease if you are traveling to areas with an outbreak or if you are exposed to travelers from areas with an outbreak.  Wash your hands often and practice good hygiene to lower the risk of catching or spreading the virus. What should I do if I am sick? General instructions to stop the spread of infection  Wash your hands often with soap and water for at least 20 seconds. If soap and water are not available, use alcohol-based hand sanitizer.  Cough or sneeze into a tissue, sleeve, or elbow. Do not cough or sneeze into your hand or the air.  If you cough or sneeze into a tissue, throw it away immediately and wash your hands.  Stay home unless you must get medical care. Call your health care provider or local health authority before you get medical care.  Avoid public areas. Do not take public transportation, if possible.  If you can, wear a mask if you must go out of the house or if you are in close contact with someone  who is not sick. Make sure your mask covers your nose and mouth. Keep your home clean  Disinfect objects and surfaces that are frequently touched every day. This may include: ? Counters and tables. ? Doorknobs and light switches. ? Sinks and faucets. ? Electronics such as phones, remote controls, keyboards, computers, and tablets.  Wash dishes in hot, soapy water or use a dishwasher. Air-dry your dishes.  Wash laundry in hot water. Prevent infecting other household members  Let healthy household members care for children and pets, if possible. If you have to care for children or pets, wash your hands often and wear a mask.  Sleep in a different bedroom or bed, if possible.  Do not share personal items, such as razors, toothbrushes, deodorant, combs, brushes, towels, and washcloths. Where to find more information Centers for Disease Control and Prevention (CDC)  Information and news updates: www.cdc.gov/coronavirus/2019-ncov World Health Organization (WHO)  Information and news updates: www.who.int/emergencies/diseases/novel-coronavirus-2019  Coronavirus health topic: www.who.int/health-topics/coronavirus  Questions and answers on COVID-19: www.who.int/news-room/q-a-detail/q-a-coronaviruses  Global tracker: who.sprinklr.com American Academy of Pediatrics (AAP)  Information for families: www.healthychildren.org/English/health-issues/conditions/chest-lungs/Pages/2019-Novel-Coronavirus.aspx The coronavirus situation is changing rapidly. Check your local health authority website or the CDC and WHO websites for updates and news. When should I contact a health care provider?  Contact your health care provider if you have symptoms of an infection, such as fever or cough, and you: ? Have been near anyone who is known to have coronavirus disease. ? Have come into contact with a person who is suspected to have coronavirus disease. ? Have traveled to an area where there is   an outbreak of  COVID-19. When should I get emergency medical care?  Get help right away by calling your local emergency services (911 in the [place] if you have: ? Trouble breathing. ? Pain or pressure in your chest. ? Confusion. ? Blue-tinged lips and fingernails. ? Difficulty waking from sleep. ? Symptoms that get worse. Let the emergency medical personnel know if you think you have coronavirus disease. Summary  A new respiratory virus is spreading from person to person and causing COVID-19 (coronavirus disease).  The virus that causes COVID-19 appears to spread easily. It spreads from one person to another through droplets from breathing, speaking, singing, coughing, or sneezing.  Older adults and those with chronic diseases are at higher risk of disease. If you are at higher risk for complications, take extra precautions.  There is currently no vaccine to prevent coronavirus disease. There are no medicines, such as antibiotics or antivirals, to treat the virus.  You can protect yourself and your family by washing your hands often, avoiding touching your face, and covering your coughs and sneezes. This information is not intended to replace advice given to you by your health care provider. Make sure you discuss any questions you have with your health care provider. Document Revised: 09/20/2019 Document Reviewed: 03/19/2019 Elsevier Patient Education  2020 [employer]  COVID-19: How to Protect Yourself and Others Know how it spreads  There is currently no vaccine to prevent coronavirus disease 2019 (COVID-19).  The best way to prevent illness is to avoid being exposed to this virus.  The virus is thought to spread mainly from person-to-person. ? Between people who are in close contact with one another (within about 6 feet). ? Through respiratory droplets produced when an infected person coughs, sneezes or talks. ? These droplets can land in the mouths or noses of people who are nearby or  possibly be inhaled into the lungs. ? COVID-19 may be spread by people who are not showing symptoms. Everyone should Clean your hands often  Wash your hands often with soap and water for at least 20 seconds especially after you have been in a public place, or after blowing your nose, coughing, or sneezing.  If soap and water are not readily available, use a hand sanitizer that contains at least 60% alcohol. Cover all surfaces of your hands and rub them together until they feel dry.  Avoid touching your eyes, nose, and mouth with unwashed hands. Avoid close contact  Limit contact with others as much as possible.  Avoid close contact with people who are sick.  Put distance between yourself and other people. ? Remember that some people without symptoms may be able to spread virus. ? This is especially important for people who are at higher risk of getting very sick.www.cdc.gov/coronavirus/2019-ncov/need-extra-precautions/people-at-higher-risk.html Cover your mouth and nose with a mask when around others  You could spread COVID-19 to others even if you do not feel sick.  Everyone should wear a mask in public settings and when around people not living in their household, especially when social distancing is difficult to maintain. ? Masks should not be placed on young children under age 2, anyone who has trouble breathing, or is unconscious, incapacitated or otherwise unable to remove the mask without assistance.  The mask is meant to protect other people in case you are infected.  Do NOT use a facemask meant for a healthcare worker.  Continue to keep about 6 feet between yourself and others. The mask is not a substitute   for social distancing. Cover coughs and sneezes  Always cover your mouth and nose with a tissue when you cough or sneeze or use the inside of your elbow.  Throw used tissues in the trash.  Immediately wash your hands with soap and water for at least 20 seconds. If soap  and water are not readily available, clean your hands with a hand sanitizer that contains at least 60% alcohol. Clean and disinfect  Clean AND disinfect frequently touched surfaces daily. This includes tables, doorknobs, light switches, countertops, handles, desks, phones, keyboards, toilets, faucets, and sinks. www.cdc.gov/coronavirus/2019-ncov/prevent-getting-sick/disinfecting-your-home.html  If surfaces are dirty, clean them: Use detergent or soap and water prior to disinfection.  Then, use a household disinfectant. You can see a list of EPA-registered household disinfectants here. cdc.gov/coronavirus 08/07/2019 This information is not intended to replace advice given to you by your health care provider. Make sure you discuss any questions you have with your health care provider. Document Revised: 08/15/2019 Document Reviewed: 06/13/2019 Elsevier Patient Education  2020 Elsevier Inc.  

## 2020-03-13 NOTE — Plan of Care (Signed)
Discharged to home

## 2020-08-21 ENCOUNTER — Inpatient Hospital Stay: Payer: BLUE CROSS/BLUE SHIELD | Attending: Oncology

## 2020-08-21 ENCOUNTER — Other Ambulatory Visit: Payer: Self-pay

## 2020-08-21 DIAGNOSIS — D696 Thrombocytopenia, unspecified: Secondary | ICD-10-CM | POA: Diagnosis not present

## 2020-08-21 LAB — CBC
HCT: 49.4 % (ref 39.0–52.0)
Hemoglobin: 15.8 g/dL (ref 13.0–17.0)
MCH: 27.1 pg (ref 26.0–34.0)
MCHC: 32 g/dL (ref 30.0–36.0)
MCV: 84.7 fL (ref 80.0–100.0)
Platelets: 121 10*3/uL — ABNORMAL LOW (ref 150–400)
RBC: 5.83 MIL/uL — ABNORMAL HIGH (ref 4.22–5.81)
RDW: 12.8 % (ref 11.5–15.5)
WBC: 4.3 10*3/uL (ref 4.0–10.5)

## 2020-08-23 NOTE — Progress Notes (Signed)
[place]  [phone]) [phone] [phone]) [phone]  ID: Charles West OB: 15-Mar-1976  MR#: [id number]  DQQ#:[id number]  Patient Care Team: Baxter Hire, MD as PCP - General (Internal Medicine)  I connected with Charles West on 08/26/20 at  2:15 PM EDT by video enabled telemedicine visit and verified that I am speaking with the correct person using two identifiers.   I discussed the limitations, risks, security and privacy concerns of performing an evaluation and management service by telemedicine and the availability of in-person appointments. I also discussed with the patient that there may be a patient responsible charge related to this service. The patient expressed understanding and agreed to proceed.   Other persons participating in the visit and their role in the encounter: Patient, MD.  Patient's location: Home. Provider's location: Clinic.  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  INTERVAL HISTORY: Patient agreed to video assisted telemedicine visit for repeat laboratory work and further evaluation.  He continues to feel well and remains asymptomatic. He has no neurologic complaints.  He denies any recent fevers or illnesses.  He has a good appetite and denies weight loss.  He has no chest pain, shortness of breath, cough, or hemoptysis.  He denies any nausea, vomiting, constipation, or diarrhea.  He has no urinary complaints.  Patient offers no specific complaints today.  REVIEW OF SYSTEMS:   Review of Systems  Constitutional: Negative.  Negative for fever, malaise/fatigue and weight loss.  Respiratory: Negative.  Negative for cough and shortness of breath.   Cardiovascular: Negative.  Negative for chest pain and leg swelling.  Gastrointestinal: Negative.  Negative for abdominal pain.  Genitourinary: Negative.  Negative for dysuria.  Musculoskeletal: Negative.  Negative for back pain.  Skin: Negative.  Negative for rash.  Neurological:  Negative.  Negative for dizziness, focal weakness, weakness and headaches.  Endo/Heme/Allergies: Does not bruise/bleed easily.  Psychiatric/Behavioral: Negative.  The patient is not nervous/anxious.     As per HPI. Otherwise, a complete review of systems is negative.  PAST MEDICAL HISTORY: Past Medical History:  Diagnosis Date  . Hyperlipidemia   . Thrombocytopenia ([place])     PAST SURGICAL HISTORY: History reviewed. No pertinent surgical history.  FAMILY HISTORY: History reviewed. No pertinent family history.  ADVANCED DIRECTIVES (Y/N):  N  HEALTH MAINTENANCE: Social History   Tobacco Use  . Smoking status: Never Smoker  . Smokeless tobacco: Never Used  Vaping Use  . Vaping Use: Never used  Substance Use Topics  . Alcohol use: Never  . Drug use: Never     Colonoscopy:  PAP:  Bone density:  Lipid panel:  No Known Allergies  No current outpatient medications on file.   No current facility-administered medications for this visit.    OBJECTIVE: There were no vitals filed for this visit.   There is no height or weight on file to calculate BMI.    ECOG FS:0 - Asymptomatic  General: Well-developed, well-nourished, no acute distress. HEENT: Normocephalic. Neuro: Alert, answering all questions appropriately. Cranial nerves grossly intact. Psych: Normal affect.   LAB RESULTS:  Lab Results  Component Value Date   NA 137 03/13/2020   K 4.3 03/13/2020   CL 106 03/13/2020   CO2 24 03/13/2020   GLUCOSE 86 03/13/2020   BUN 21 (H) 03/13/2020   CREATININE 0.98 03/13/2020   CALCIUM 8.7 (L) 03/13/2020   PROT 6.8 03/13/2020   ALBUMIN 3.1 (L) 03/13/2020   AST 52 (H) 03/13/2020   ALT 114 (H) 03/13/2020  ALKPHOS 45 03/13/2020   BILITOT 0.9 03/13/2020   GFRNONAA >60 03/13/2020   GFRAA >60 03/13/2020    Lab Results  Component Value Date   WBC 4.3 08/21/2020   NEUTROABS 5.0 03/13/2020   HGB 15.8 08/21/2020   HCT 49.4 08/21/2020   MCV 84.7 08/21/2020   PLT 121  (L) 08/21/2020     STUDIES: No results found.  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  PLAN:    1. 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  Patient's most recent platelet count remains decreased, but has mildly improved to 121.  His platelets have been decreased and essentially chronic and unchanged since at least December 2018.  Previously, the remainder of his laboratory work including iron stores, B12, folate, reticulocyte panel, and LDH are all within normal limits.  ANA and platelet antibodies are also negative.  Ultrasound of the spleen on February 06, 2020 did not reveal splenomegaly.  No intervention is needed at this time.  After lengthy discussion with the patient, is agreed upon that no further follow-up is necessary.  Continue to monitor platelets 1-2 times per year.  If they decrease in fall persistently below 100, please refer patient back for further evaluation.  I provided 20 minutes of face-to-face video visit time during this encounter which included chart review, counseling, and coordination of care as documented above.   Patient expressed understanding and was in agreement with this plan. He also understands that He can call clinic at any time with any questions, concerns, or complaints.    [name], MD   08/26/2020 3:26 PM

## 2020-08-24 ENCOUNTER — Inpatient Hospital Stay: Payer: BLUE CROSS/BLUE SHIELD

## 2020-08-25 ENCOUNTER — Encounter: Payer: Self-pay | Admitting: Oncology

## 2020-08-25 ENCOUNTER — Inpatient Hospital Stay (HOSPITAL_BASED_OUTPATIENT_CLINIC_OR_DEPARTMENT_OTHER): Payer: BLUE CROSS/BLUE SHIELD | Admitting: Oncology

## 2020-08-25 ENCOUNTER — Other Ambulatory Visit: Payer: Self-pay

## 2020-08-25 DIAGNOSIS — D696 Thrombocytopenia, unspecified: Secondary | ICD-10-CM

## 2020-08-25 NOTE — Progress Notes (Signed)
Patient denies any concerns today.  
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
## 2021-09-03 ENCOUNTER — Other Ambulatory Visit: Payer: Self-pay

## 2021-09-03 ENCOUNTER — Emergency Department
Admission: EM | Admit: 2021-09-03 | Discharge: 2021-09-03 | Disposition: A | Discharge status: Home / Self Care | Payer: BLUE CROSS/BLUE SHIELD | Attending: Emergency Medicine | Admitting: Emergency Medicine

## 2021-09-03 ENCOUNTER — Emergency Department: Payer: BLUE CROSS/BLUE SHIELD

## 2021-09-03 DIAGNOSIS — R0789 Other chest pain: Secondary | ICD-10-CM | POA: Insufficient documentation

## 2021-09-03 DIAGNOSIS — R1010 Upper abdominal pain, unspecified: Secondary | ICD-10-CM | POA: Diagnosis not present

## 2021-09-03 LAB — BASIC METABOLIC PANEL
Anion gap: 7 (ref 5–15)
BUN: 11 mg/dL (ref 6–20)
CO2: 26 mmol/L (ref 22–32)
Calcium: 9 mg/dL (ref 8.9–10.3)
Chloride: 105 mmol/L (ref 98–111)
Creatinine, Ser: 1.22 mg/dL (ref 0.61–1.24)
GFR, Estimated: >60 mL/min (ref >60)
Glucose, Bld: 91 mg/dL (ref 70–99)
Potassium: 4 mmol/L (ref 3.5–5.1)
Sodium: 138 mmol/L (ref 135–145)

## 2021-09-03 LAB — CBC
HCT: 50.4 % (ref 39.0–52.0)
Hemoglobin: 16.1 g/dL (ref 13.0–17.0)
MCH: 28.5 pg (ref 26.0–34.0)
MCHC: 31.9 g/dL (ref 30.0–36.0)
MCV: 89.2 fL (ref 80.0–100.0)
Platelets: 104 10*3/uL — ABNORMAL LOW (ref 150–400)
RBC: 5.65 MIL/uL (ref 4.22–5.81)
RDW: 13.1 % (ref 11.5–15.5)
WBC: 3.9 10*3/uL — ABNORMAL LOW (ref 4.0–10.5)
nRBC: 0 % (ref 0.0–0.2)

## 2021-09-03 LAB — TROPONIN I (HIGH SENSITIVITY): Troponin I (High Sensitivity): 4 ng/L (ref <18)

## 2021-09-03 MED ORDER — FAMOTIDINE 20 MG PO TABS: 20.0000 mg | ORAL_TABLET | Freq: Two times a day (BID) | ORAL | 0 refills | Status: AC

## 2021-09-03 NOTE — ED Triage Notes (Signed)
PT here with chest discomfort that starts in the center and makes him burp. Pt states that when he eats spicy foods he feels the pain. Pt denies N/V/D. Pt states that pain does not last long.

## 2021-09-03 NOTE — ED Provider Notes (Signed)
Emergency Medicine Provider Triage Evaluation Note  Rydge Texidor, a 45 y.o. male  was evaluated in triage.  Pt complains of intermittent epigastric pain with referral into the upper central chest, since last week. He denies diaphoresis, SOB, N/V. He notes symptoms are fleeting and post-prandial associated with spicy foods. No alleviating agents attempted.  Review of Systems  Positive: Epigastric abd pain  Negative: NVD  Physical Exam  BP 121/77 (BP Location: Left Arm)   Pulse 67   Temp 98.3 F (36.8 C) (Oral)   Resp 18   Ht 5\' 11"  (1.803 m)   Wt 94.8 kg   SpO2 97%   BMI 29.15 kg/m  Gen:   Awake, no distress  NAD Resp:  Normal effort CTA MSK:   Moves extremities without difficulty  Other:  CVS: RRR  Medical Decision Making  Medically screening exam initiated at 12:46 PM.  Appropriate orders placed.  Adama Sandiford was informed that the remainder of the evaluation will be completed by another provider, this initial triage assessment does not replace that evaluation, and the importance of remaining in the ED until their evaluation is complete.  Patient with ED evaluation of intermittent episodes epigastric pain    Carmie End, Dannielle Karvonen, PA-C 09/03/21 1249    Carrie Mew, MD 09/03/21 1727

## 2021-09-03 NOTE — ED Provider Notes (Signed)
[facility] Emergency Department Provider Note  ____________________________________________  Time seen: Approximately 4:54 PM  I have reviewed the triage vital signs and the nursing notes.   HISTORY  Chief Complaint Chest Pain    HPI Charles West is a 45 y.o. male with a history of hyperlipidemia, thrombocytopenia who comes ED complaining of upper abdominal pain that radiates up into the chest, fleeting, lasting just a second at a time, intermittent and recurring.  No aggravating or alleviating factors.  Not exertional, not pleuritic.  Started after eating spicy food which is not part of his typical diet.  He is physically active, plays tennis about 3 hours a day without significant symptoms during that time.    Past Medical History:  Diagnosis Date   Hyperlipidemia    Thrombocytopenia [facility])      Patient Active Problem List   Diagnosis Date Noted   Acute hypoxemic respiratory failure due to COVID-19 [facility]) 03/09/2020   Thrombocytopenia ([place]) 03/09/2020     No past surgical history on file.   Prior to Admission medications   Medication Sig Start Date End Date Taking? Authorizing Provider  famotidine (PEPCID) 20 MG tablet Take 1 tablet (20 mg total) by mouth 2 (two) times daily. 09/03/21  Yes Carrie Mew, MD     Allergies Patient has no known allergies.   No family history on file.  Social History Social History   Tobacco Use   Smoking status: Never   Smokeless tobacco: Never  Vaping Use   Vaping Use: Never used  Substance Use Topics   Alcohol use: Never   Drug use: Never    Review of Systems  Constitutional:   No fever or chills.  ENT:   No sore throat. No rhinorrhea. Cardiovascular:   Positive chest pain as above without syncope. Respiratory:   No dyspnea or cough. Gastrointestinal:   Negative for abdominal pain, vomiting and diarrhea.  Musculoskeletal:   Negative for focal pain or swelling All other systems  reviewed and are negative except as documented above in ROS and HPI.  ____________________________________________   PHYSICAL EXAM:  VITAL SIGNS: ED Triage Vitals  Enc Vitals Group     BP 09/03/21 1244 121/77     Pulse Rate 09/03/21 1244 67     Resp 09/03/21 1244 18     Temp 09/03/21 1240 98.3 F (36.8 C)     Temp Source 09/03/21 1240 Oral     SpO2 09/03/21 1244 97 %     Weight 09/03/21 1243 209 lb (94.8 kg)     Height 09/03/21 1243 5\' 11"  (1.803 m)     Head Circumference --      Peak Flow --      Pain Score 09/03/21 1243 0     Pain Loc --      Pain Edu? --      Excl. in [place]? --     Vital signs reviewed, nursing assessments reviewed.   Constitutional:   Alert and oriented. Non-toxic appearance. Eyes:   Conjunctivae are normal. EOMI. PERRL. ENT      Head:   Normocephalic and atraumatic.      Nose:   Wearing a mask.      Mouth/Throat:   Wearing a mask.      Neck:   No meningismus. Full ROM. Hematological/Lymphatic/Immunilogical:   No cervical lymphadenopathy. Cardiovascular:   RRR. Symmetric bilateral radial and DP pulses.  No murmurs. Cap refill less than 2 seconds. Respiratory:   Normal respiratory effort  without tachypnea/retractions. Breath sounds are clear and equal bilaterally. No wheezes/rales/rhonchi. Gastrointestinal:   Soft and nontender. Non distended. There is no CVA tenderness.  No rebound, rigidity, or guarding. Genitourinary:   deferred Musculoskeletal:   Normal range of motion in all extremities. No joint effusions.  No lower extremity tenderness.  No edema. Neurologic:   Normal speech and language.  Motor grossly intact. No acute focal neurologic deficits are appreciated.  Skin:    Skin is warm, dry and intact. No rash noted.  No petechiae, purpura, or bullae.  ____________________________________________    LABS (pertinent positives/negatives) (all labs ordered are listed, but only abnormal results are displayed) Labs Reviewed  CBC - Abnormal;  Notable for the following components:      Result Value   WBC 3.9 (*)    Platelets 104 (*)    All other components within normal limits  BASIC METABOLIC PANEL  TROPONIN I (HIGH SENSITIVITY)  TROPONIN I (HIGH SENSITIVITY)   ____________________________________________   EKG  Interpreted by me  Date: 09/03/2021  Rate: 76  Rhythm: normal sinus rhythm  QRS Axis: normal  Intervals: normal  ST/T Wave abnormalities: normal  Conduction Disutrbances: none  Narrative Interpretation: unremarkable     ____________________________________________    RADIOLOGY  DG Chest 2 View  Result Date: 09/03/2021 CLINICAL DATA:  Intermittent epigastric pain radiating to the central chest. EXAM: CHEST - 2 VIEW COMPARISON:  Chest x-ray dated March 09, 2020. FINDINGS: The heart size and mediastinal contours are within normal limits. Both lungs are clear. The visualized skeletal structures are unremarkable. IMPRESSION: No active cardiopulmonary disease. Electronically Signed   By: Titus Dubin M.D.   On: 09/03/2021 15:58    ____________________________________________   PROCEDURES Procedures  ____________________________________________    CLINICAL IMPRESSION / ASSESSMENT AND PLAN / ED COURSE  Medications ordered in the ED: Medications - No data to display  Pertinent labs & imaging results that were available during my care of the patient were reviewed by me and considered in my medical decision making (see chart for details).  Charles West was evaluated in Emergency Department on 09/03/2021 for the symptoms described in the history of present illness. He was evaluated in the context of the global COVID-19 pandemic, which necessitated consideration that the patient might be at risk for infection with the SARS-CoV-2 virus that causes COVID-19. Institutional protocols and algorithms that pertain to the evaluation of patients at risk for COVID-19 are in a state of rapid change based on  information released by regulatory bodies including the CDC and federal and state organizations. These policies and algorithms were followed during the patient's care in the ED.   Patient presents with atypical chest pain. Considering the patient's symptoms, medical history, and physical examination today, I have low suspicion for ACS, PE, TAD, pneumothorax, carditis, mediastinitis, pneumonia, CHF, or sepsis.  Doubt pancreatitis, cholecystitis, choledocholithiasis or other acute intra-abdominal process.  History is very suspicious for GERD/gastritis.  Recommend Pepcid for a week. EKG chest x-ray and labs are all normal.  Serial troponin testing not indicated, stable for discharge       ____________________________________________   FINAL CLINICAL IMPRESSION(S) / ED DIAGNOSES    Final diagnoses:  Atypical chest pain     ED Discharge Orders          Ordered    famotidine (PEPCID) 20 MG tablet  2 times daily        09/03/21 1654            Portions  of this note were generated with dragon dictation software. Dictation errors may occur despite best attempts at proofreading.    Carrie Mew, MD 09/03/21 [phone]

## 2021-09-03 NOTE — ED Notes (Signed)
Patient stable and discharged with all personal belongings and AVS. AVS and discharge instructions reviewed with patient and opportunity for questions provided.   

## 2022-12-29 ENCOUNTER — Encounter: Payer: Self-pay | Admitting: Gastroenterology

## 2022-12-29 NOTE — H&P (Signed)
Pre-Procedure H&P   Patient ID: Charles West is a 47 y.o. male.  Gastroenterology Provider: Annamaria Helling, DO  Referring Provider: Dr. Edwina Barth PCP: Baxter Hire, MD  Date: 12/30/2022  HPI Charles West is a 47 y.o. male who presents today for Colonoscopy for Initial screening colonoscopy .  Patient with daily bowel movement without melena hematochezia diarrhea or constipation.  He is undergoing his initial screening colonoscopy today.  He has no family history of colon cancer or colon polyps.  He does have a history of thrombocytopenia with most recent measurement of 112,000.  Hemoglobin 15.3 MCV 88 creatinine 1.2   Past Medical History:  Diagnosis Date   Hyperlipidemia    Thrombocytopenia ([place])     History reviewed. No pertinent surgical history.  Family History No h/o GI disease or malignancy  Review of Systems  Constitutional:  Negative for activity change, appetite change, chills, diaphoresis, fatigue, fever and unexpected weight change.  HENT:  Negative for trouble swallowing and voice change.   Respiratory:  Negative for shortness of breath and wheezing.   Cardiovascular:  Negative for chest pain, palpitations and leg swelling.  Gastrointestinal:  Negative for abdominal distention, abdominal pain, anal bleeding, blood in stool, constipation, diarrhea, nausea and vomiting.  Musculoskeletal:  Negative for arthralgias and myalgias.  Skin:  Negative for color change and pallor.  Neurological:  Negative for dizziness, syncope and weakness.  Psychiatric/Behavioral:  Negative for confusion. The patient is not nervous/anxious.   All other systems reviewed and are negative.    Medications No current facility-administered medications on file prior to encounter.   Current Outpatient Medications on File Prior to Encounter  Medication Sig Dispense Refill   famotidine (PEPCID) 20 MG tablet Take 1 tablet (20 mg total) by mouth 2 (two) times daily. (Patient  not taking: Reported on 12/30/2022) 60 tablet 0    Pertinent medications related to GI and procedure were reviewed by me with the patient prior to the procedure   Current Facility-Administered Medications:    0.9 %  sodium chloride infusion, , Intravenous, Continuous, Annamaria Helling, DO, Last Rate: 20 mL/hr at 12/30/22 0932, Continued from Pre-op at 12/30/22 0932      No Known Allergies Allergies were reviewed by me prior to the procedure  Objective   Body mass index is 29.85 kg/m. Vitals:   12/30/22 0848  BP: 122/74  Pulse: 74  Resp: 20  Temp: (!) 96.5 F (35.8 C)  TempSrc: Temporal  SpO2: 100%  Weight: 97.1 kg  Height: '5\' 11"'$  (1.803 m)     Physical Exam Vitals and nursing note reviewed.  Constitutional:      General: He is not in acute distress.    Appearance: Normal appearance. He is not ill-appearing, toxic-appearing or diaphoretic.  HENT:     Head: Normocephalic and atraumatic.     Nose: Nose normal.     Mouth/Throat:     Mouth: Mucous membranes are moist.     Pharynx: Oropharynx is clear.  Eyes:     General: No scleral icterus.    Extraocular Movements: Extraocular movements intact.  Cardiovascular:     Rate and Rhythm: Normal rate and regular rhythm.     Heart sounds: Normal heart sounds. No murmur heard.    No friction rub. No gallop.  Pulmonary:     Effort: Pulmonary effort is normal. No respiratory distress.     Breath sounds: Normal breath sounds. No wheezing, rhonchi or rales.  Abdominal:  General: Bowel sounds are normal. There is no distension.     Palpations: Abdomen is soft.     Tenderness: There is no abdominal tenderness. There is no guarding or rebound.  Musculoskeletal:     Cervical back: Neck supple.     Right lower leg: No edema.     Left lower leg: No edema.  Skin:    General: Skin is warm and dry.     Coloration: Skin is not jaundiced or pale.  Neurological:     General: No focal deficit present.     Mental Status: He  is alert and oriented to person, place, and time. Mental status is at baseline.  Psychiatric:        Mood and Affect: Mood normal.        Behavior: Behavior normal.        Thought Content: Thought content normal.        Judgment: Judgment normal.      Assessment:  [name] is a 47 y.o. male  who presents today for Colonoscopy for Initial screening colonoscopy .  Plan:  Colonoscopy with possible intervention today  Colonoscopy with possible biopsy, control of bleeding, polypectomy, and interventions as necessary has been discussed with the patient/patient representative. Informed consent was obtained from the patient/patient representative after explaining the indication, nature, and risks of the procedure including but not limited to death, bleeding, perforation, missed neoplasm/lesions, cardiorespiratory compromise, and reaction to medications. Opportunity for questions was given and appropriate answers were provided. Patient/patient representative has verbalized understanding is amenable to undergoing the procedure.   Annamaria Helling, DO  [facility]  Portions of the record may have been created with voice recognition software. Occasional wrong-word or 'sound-a-like' substitutions may have occurred due to the inherent limitations of voice recognition software.  Read the chart carefully and recognize, using context, where substitutions may have occurred.

## 2022-12-30 ENCOUNTER — Encounter: Payer: Self-pay | Admitting: Gastroenterology

## 2022-12-30 ENCOUNTER — Encounter
Admission: RE | Disposition: A | Discharge status: Home / Self Care | Payer: Self-pay | Source: Home / Self Care | Attending: Gastroenterology

## 2022-12-30 ENCOUNTER — Ambulatory Visit: Payer: BC Managed Care – PPO | Admitting: Registered Nurse

## 2022-12-30 ENCOUNTER — Ambulatory Visit
Admission: RE | Admit: 2022-12-30 | Discharge: 2022-12-30 | Disposition: A | Discharge status: Home / Self Care | Payer: BC Managed Care – PPO | Attending: Gastroenterology | Admitting: Gastroenterology

## 2022-12-30 DIAGNOSIS — D125 Benign neoplasm of sigmoid colon: Secondary | ICD-10-CM | POA: Diagnosis not present

## 2022-12-30 DIAGNOSIS — D128 Benign neoplasm of rectum: Secondary | ICD-10-CM | POA: Insufficient documentation

## 2022-12-30 DIAGNOSIS — D123 Benign neoplasm of transverse colon: Secondary | ICD-10-CM | POA: Diagnosis not present

## 2022-12-30 DIAGNOSIS — E785 Hyperlipidemia, unspecified: Secondary | ICD-10-CM | POA: Insufficient documentation

## 2022-12-30 DIAGNOSIS — Z1211 Encounter for screening for malignant neoplasm of colon: Secondary | ICD-10-CM | POA: Diagnosis present

## 2022-12-30 HISTORY — PX: COLONOSCOPY: SHX5424

## 2022-12-30 SURGERY — COLONOSCOPY
Anesthesia: General

## 2022-12-30 MED ORDER — DEXMEDETOMIDINE HCL IN NACL 200 MCG/50ML IV SOLN
INTRAVENOUS | Status: DC | PRN
  Administered 2022-12-30: 4 ug via INTRAVENOUS
  Administered 2022-12-30: 8 ug via INTRAVENOUS

## 2022-12-30 MED ORDER — PROPOFOL 1000 MG/100ML IV EMUL
INTRAVENOUS | Status: AC
  Filled 2022-12-30: qty 100

## 2022-12-30 MED ORDER — SODIUM CHLORIDE 0.9 % IV SOLN
INTRAVENOUS | Status: DC
  Administered 2022-12-30: 20 mL/h via INTRAVENOUS

## 2022-12-30 MED ORDER — PROPOFOL 10 MG/ML IV BOLUS
INTRAVENOUS | Status: DC | PRN
  Administered 2022-12-30: 70 mg via INTRAVENOUS
  Administered 2022-12-30 (×2): 10 mg via INTRAVENOUS

## 2022-12-30 MED ORDER — PROPOFOL 500 MG/50ML IV EMUL
INTRAVENOUS | Status: DC | PRN
  Administered 2022-12-30: 150 ug/kg/min via INTRAVENOUS

## 2022-12-30 MED ORDER — PHENYLEPHRINE HCL (PRESSORS) 10 MG/ML IV SOLN
INTRAVENOUS | Status: DC | PRN
  Administered 2022-12-30 (×3): 160 ug via INTRAVENOUS

## 2022-12-30 NOTE — Anesthesia Postprocedure Evaluation (Signed)
Anesthesia Post Note  Patient: Charles West  Procedure(s) Performed: COLONOSCOPY  Patient location during evaluation: Endoscopy Anesthesia Type: General Level of consciousness: awake and alert Pain management: pain level controlled Vital Signs Assessment: post-procedure vital signs reviewed and stable Respiratory status: spontaneous breathing, nonlabored ventilation, respiratory function stable and patient connected to nasal cannula oxygen Cardiovascular status: blood pressure returned to baseline and stable Postop Assessment: no apparent nausea or vomiting Anesthetic complications: no   No notable events documented.   Last Vitals:  Vitals:   12/30/22 1059 12/30/22 1109  BP: (!) 93/56 (!) 93/52  Pulse:  70  Resp: (!) 23 17  Temp:    SpO2: 98% 99%    Last Pain:  Vitals:   12/30/22 1109  TempSrc:   PainSc: Asleep                 Ilene Qua

## 2022-12-30 NOTE — Transfer of Care (Signed)
Immediate Anesthesia Transfer of Care Note  Patient: Charles West  Procedure(s) Performed: Procedure(s): COLONOSCOPY (N/A)  Patient Location: PACU and Endoscopy Unit  Anesthesia Type:General  Level of Consciousness: sedated  Airway & Oxygen Therapy: Patient Spontanous Breathing and Patient connected to nasal cannula oxygen  Post-op Assessment: Report given to RN and Post -op Vital signs reviewed and stable  Post vital signs: Reviewed and stable  Last Vitals:  Vitals:   12/30/22 1056 12/30/22 1059  BP: (!) 93/56 (!) 93/56  Pulse: 61   Resp: (!) 23 (!) 23  Temp:    SpO2: 00% 86%    Complications: No apparent anesthesia complications

## 2022-12-30 NOTE — Op Note (Signed)
[facility] Gastroenterology Patient Name: Charles West Procedure Date: 12/30/2022 10:12 AM MRN: [id number] Account #: [id number] Date of Birth: [birth year] Admit Type: Outpatient Age: 47 Room: [facility] ENDO ROOM 1 Gender: Male Note Status: Finalized Instrument Name: Colonoscope [id number] Procedure:             Colonoscopy Indications:           Screening for colorectal malignant neoplasm Providers:             Rueben Bash, DO Referring MD:          Baxter Hire, MD (Referring MD) Medicines:             Monitored Anesthesia Care Complications:         No immediate complications. Estimated blood loss:                         Minimal. Procedure:             Pre-Anesthesia Assessment:                        - Prior to the procedure, a History and Physical was                         performed, and patient medications and allergies were                         reviewed. The patient is competent. The risks and                         benefits of the procedure and the sedation options and                         risks were discussed with the patient. All questions                         were answered and informed consent was obtained.                         Patient identification and proposed procedure were                         verified by the physician, the nurse, the anesthetist                         and the technician in the endoscopy suite. Mental                         Status Examination: alert and oriented. Airway                         Examination: normal oropharyngeal airway and neck                         mobility. Respiratory Examination: clear to                         auscultation. CV Examination: RRR, no murmurs, no S3  or S4. Prophylactic Antibiotics: The patient does not                         require prophylactic antibiotics. Prior                         Anticoagulants: The patient has taken no  anticoagulant                         or antiplatelet agents. ASA Grade Assessment: II - A                         patient with mild systemic disease. After reviewing                         the risks and benefits, the patient was deemed in                         satisfactory condition to undergo the procedure. The                         anesthesia plan was to use monitored anesthesia care                         (MAC). Immediately prior to administration of                         medications, the patient was re-assessed for adequacy                         to receive sedatives. The heart rate, respiratory                         rate, oxygen saturations, blood pressure, adequacy of                         pulmonary ventilation, and response to care were                         monitored throughout the procedure. The physical                         status of the patient was re-assessed after the                         procedure.                        After obtaining informed consent, the colonoscope was                         passed under direct vision. Throughout the procedure,                         the patient's blood pressure, pulse, and oxygen                         saturations were monitored continuously. The  Colonoscope was introduced through the anus and                         advanced to the the terminal ileum, with                         identification of the appendiceal orifice and IC                         valve. The colonoscopy was performed without                         difficulty. The patient tolerated the procedure well.                         The quality of the bowel preparation was evaluated                         using the BBPS [facility] Bowel Preparation Scale) with                         scores of: Right Colon = 3, Transverse Colon = 3 and                         Left Colon = 3 (entire mucosa seen well with no                          residual staining, small fragments of stool or opaque                         liquid). The total BBPS score equals 9. The terminal                         ileum, ileocecal valve, appendiceal orifice, and                         rectum were photographed. Findings:      The perianal and digital rectal examinations were normal. Pertinent       negatives include normal sphincter tone.      The terminal ileum appeared normal. Estimated blood loss: none.      Retroflexion in the right colon was performed.      Seven sessile polyps were found in the rectum (2), sigmoid colon (3) and       transverse colon (2). The polyps were 1 to 2 mm in size. These polyps       were removed with a jumbo cold forceps. Resection and retrieval were       complete. Estimated blood loss was minimal. Estimated blood loss was       minimal.      A 3 to 4 mm polyp was found in the transverse colon. The polyp was       sessile. The polyp was removed with a cold snare. Resection and       retrieval were complete. Estimated blood loss was minimal.      The exam was otherwise without abnormality on direct and retroflexion       views. Impression:            -  The examined portion of the ileum was normal.                        - Seven 1 to 2 mm polyps in the rectum, in the sigmoid                         colon and in the transverse colon, removed with a                         jumbo cold forceps. Resected and retrieved.                        - One 3 to 4 mm polyp in the transverse colon, removed                         with a cold snare. Resected and retrieved.                        - The examination was otherwise normal on direct and                         retroflexion views. Recommendation:        - Patient has a contact number available for                         emergencies. The signs and symptoms of potential                         delayed complications were discussed with the patient.                          Return to normal activities tomorrow. Written                         discharge instructions were provided to the patient.                        - Discharge patient to home.                        - Resume previous diet.                        - Continue present medications.                        - Await pathology results.                        - No aspirin, ibuprofen, naproxen, or other                         non-steroidal anti-inflammatory drugs for 5 days after                         polyp removal.                        - Repeat colonoscopy for surveillance based on  pathology results.                        - Return to referring physician as previously                         scheduled.                        - The findings and recommendations were discussed with                         the patient. Procedure Code(s):     --- Professional ---                        [phone], Colonoscopy, flexible; with removal of                         tumor(s), polyp(s), or other lesion(s) by snare                         technique                        45380, 1, Colonoscopy, flexible; with biopsy, single                         or multiple Diagnosis Code(s):     --- Professional ---                        Z12.11, Encounter for screening for malignant neoplasm                         of colon                        D12.8, Benign neoplasm of rectum                        D12.5, Benign neoplasm of sigmoid colon                        D12.3, Benign neoplasm of transverse colon (hepatic                         flexure or splenic flexure) CPT copyright 2022 American [employer]. All rights reserved. The codes documented in this report are preliminary and upon coder review may  be revised to meet current compliance requirements. Attending Participation:      I personally performed the entire procedure. Volney American, DO Annamaria Helling DO, DO 12/30/2022  10:54:54 AM This report has been signed electronically. Number of Addenda: 0 Note Initiated On: 12/30/2022 10:12 AM Scope Withdrawal Time: 0 hours 20 minutes 52 seconds  Total Procedure Duration: 0 hours 24 minutes 39 seconds  Estimated Blood Loss:  Estimated blood loss was minimal.      [facility]

## 2022-12-30 NOTE — Anesthesia Preprocedure Evaluation (Signed)
Anesthesia Evaluation  Patient identified by MRN, date of birth, ID band Patient awake    Reviewed: Allergy & Precautions, NPO status , Patient's Chart, lab work & pertinent test results  Airway Mallampati: II  TM Distance: >3 FB Neck ROM: full    Dental no notable dental hx.    Pulmonary neg pulmonary ROS   Pulmonary exam normal        Cardiovascular negative cardio ROS Normal cardiovascular exam     Neuro/Psych negative neurological ROS  negative psych ROS   GI/Hepatic negative GI ROS, Neg liver ROS,,,  Endo/Other  negative endocrine ROS    Renal/GU negative Renal ROS  negative genitourinary   Musculoskeletal   Abdominal   Peds  Hematology negative hematology ROS (+) thrombocytopenia   Anesthesia Other Findings Past Medical History: No date: Hyperlipidemia No date: Thrombocytopenia ([place])  History reviewed. No pertinent surgical history.  BMI    Body Mass Index: 29.85 kg/m      Reproductive/Obstetrics negative OB ROS                             Anesthesia Physical Anesthesia Plan  ASA: 2  Anesthesia Plan: General   Post-op Pain Management: Minimal or no pain anticipated   Induction: Intravenous  PONV Risk Score and Plan: Propofol infusion and TIVA  Airway Management Planned: Natural Airway and Nasal Cannula  Additional Equipment:   Intra-op Plan:   Post-operative Plan:   Informed Consent: I have reviewed the patients History and Physical, chart, labs and discussed the procedure including the risks, benefits and alternatives for the proposed anesthesia with the patient or authorized representative who has indicated his/her understanding and acceptance.     Dental Advisory Given  Plan Discussed with: Anesthesiologist, CRNA and Surgeon  Anesthesia Plan Comments: (Patient consented for risks of anesthesia including but not limited to:  - adverse reactions to  medications - risk of airway placement if required - damage to eyes, teeth, lips or other oral mucosa - nerve damage due to positioning  - sore throat or hoarseness - Damage to heart, brain, nerves, lungs, other parts of body or loss of life  Patient voiced understanding.)       Anesthesia Quick Evaluation

## 2022-12-30 NOTE — Interval H&P Note (Signed)
History and Physical Interval Note: Preprocedure H&P from 12/30/22  was reviewed and there was no interval change after seeing and examining the patient.  Written consent was obtained from the patient after discussion of risks, benefits, and alternatives. Patient has consented to proceed with Colonoscopy with possible intervention   12/30/2022 10:12 AM  Raynald Blend  has presented today for surgery, with the diagnosis of Colon cancer screening (Z12.11).  The various methods of treatment have been discussed with the patient and family. After consideration of risks, benefits and other options for treatment, the patient has consented to  Procedure(s): COLONOSCOPY (N/A) as a surgical intervention.  The patient's history has been reviewed, patient examined, no change in status, stable for surgery.  I have reviewed the patient's chart and labs.  Questions were answered to the patient's satisfaction.     Annamaria Helling

## 2022-12-30 NOTE — OR Nursing (Signed)
Called wife that patient ready for discharge. Wife stated that she can not be here to pick him up till 1230p. Will continue to monitor patient till her arrival

## 2022-12-30 NOTE — Anesthesia Procedure Notes (Signed)
Date/Time: 12/30/2022 10:30 AM  Performed by: Doreen Salvage, CRNAPre-anesthesia Checklist: Patient identified, Emergency Drugs available, Suction available and Patient being monitored Patient Re-evaluated:Patient Re-evaluated prior to induction Oxygen Delivery Method: Nasal cannula Induction Type: IV induction Dental Injury: Teeth and Oropharynx as per pre-operative assessment  Comments: Nasal cannula with etCO2 monitoring

## 2023-01-02 ENCOUNTER — Encounter: Payer: Self-pay | Admitting: Gastroenterology

## 2023-01-02 LAB — SURGICAL PATHOLOGY

## 2024-06-28 ENCOUNTER — Other Ambulatory Visit: Payer: Self-pay | Admitting: Family Medicine

## 2024-06-28 DIAGNOSIS — M5416 Radiculopathy, lumbar region: Secondary | ICD-10-CM

## 2024-07-01 ENCOUNTER — Encounter: Payer: Self-pay | Admitting: Family Medicine

## 2024-07-05 ENCOUNTER — Ambulatory Visit
Admission: RE | Admit: 2024-07-05 | Discharge: 2024-07-05 | Disposition: A | Discharge status: Home / Self Care | Source: Ambulatory Visit | Attending: Family Medicine | Admitting: Family Medicine

## 2024-07-05 DIAGNOSIS — M5416 Radiculopathy, lumbar region: Secondary | ICD-10-CM
